# Patient Record
Sex: Female | Born: 1980
Health system: Southern US, Community
[De-identification: ages and names within clinical notes are randomized; demographics above are authoritative.]

## PROBLEM LIST (undated history)

## (undated) DIAGNOSIS — F419 Anxiety disorder, unspecified: Secondary | ICD-10-CM

## (undated) DIAGNOSIS — B019 Varicella without complication: Secondary | ICD-10-CM

## (undated) DIAGNOSIS — F329 Major depressive disorder, single episode, unspecified: Secondary | ICD-10-CM

## (undated) DIAGNOSIS — E785 Hyperlipidemia, unspecified: Secondary | ICD-10-CM

## (undated) DIAGNOSIS — R32 Unspecified urinary incontinence: Secondary | ICD-10-CM

## (undated) DIAGNOSIS — K219 Gastro-esophageal reflux disease without esophagitis: Secondary | ICD-10-CM

## (undated) DIAGNOSIS — Z87442 Personal history of urinary calculi: Secondary | ICD-10-CM

## (undated) DIAGNOSIS — F32A Depression, unspecified: Secondary | ICD-10-CM

## (undated) DIAGNOSIS — N390 Urinary tract infection, site not specified: Secondary | ICD-10-CM

## (undated) DIAGNOSIS — G43909 Migraine, unspecified, not intractable, without status migrainosus: Secondary | ICD-10-CM

## (undated) HISTORY — PX: OTHER SURGICAL HISTORY: SHX169

## (undated) HISTORY — DX: Varicella without complication: B01.9

## (undated) HISTORY — DX: Migraine, unspecified, not intractable, without status migrainosus: G43.909

## (undated) HISTORY — DX: Unspecified urinary incontinence: R32

## (undated) HISTORY — DX: Depression, unspecified: F32.A

## (undated) HISTORY — DX: Gastro-esophageal reflux disease without esophagitis: K21.9

## (undated) HISTORY — DX: Urinary tract infection, site not specified: N39.0

## (undated) HISTORY — DX: Hyperlipidemia, unspecified: E78.5

## (undated) HISTORY — DX: Anxiety disorder, unspecified: F41.9

## (undated) HISTORY — DX: Major depressive disorder, single episode, unspecified: F32.9

---

## 2000-04-04 HISTORY — PX: URETERAL STENT PLACEMENT: SHX822

## 2005-10-02 ENCOUNTER — Emergency Department (HOSPITAL_COMMUNITY): Admission: EM | Admit: 2005-10-02 | Discharge: 2005-10-03 | Payer: Self-pay | Admitting: Emergency Medicine

## 2005-10-07 ENCOUNTER — Ambulatory Visit (HOSPITAL_COMMUNITY): Admission: RE | Admit: 2005-10-07 | Discharge: 2005-10-07 | Payer: Self-pay | Admitting: Emergency Medicine

## 2006-03-08 ENCOUNTER — Encounter (INDEPENDENT_AMBULATORY_CARE_PROVIDER_SITE_OTHER): Payer: Self-pay | Admitting: Specialist

## 2006-03-08 ENCOUNTER — Inpatient Hospital Stay (HOSPITAL_COMMUNITY): Admission: EM | Admit: 2006-03-08 | Discharge: 2006-03-09 | Payer: Self-pay | Admitting: Emergency Medicine

## 2007-04-05 HISTORY — PX: APPENDECTOMY: SHX54

## 2009-05-20 ENCOUNTER — Emergency Department (HOSPITAL_BASED_OUTPATIENT_CLINIC_OR_DEPARTMENT_OTHER): Admission: EM | Admit: 2009-05-20 | Discharge: 2009-05-20 | Payer: Self-pay | Admitting: Emergency Medicine

## 2009-06-02 ENCOUNTER — Encounter: Admission: RE | Admit: 2009-06-02 | Discharge: 2009-06-02 | Payer: Self-pay | Admitting: Gastroenterology

## 2010-06-24 LAB — PREGNANCY, URINE: Preg Test, Ur: NEGATIVE

## 2010-06-24 LAB — URINALYSIS, ROUTINE W REFLEX MICROSCOPIC
Bilirubin Urine: NEGATIVE
Hgb urine dipstick: NEGATIVE
Ketones, ur: NEGATIVE mg/dL
pH: 8 (ref 5.0–8.0)

## 2010-08-20 NOTE — Op Note (Signed)
NAMENOVELLA, ABRAHA              ACCOUNT NO.:  [id number]   MEDICAL RECORD NO.:  [id number]          PATIENT TYPE:  INP   LOCATION:  1615                         FACILITY:  [facility]   PHYSICIAN:  Anselm Pancoast. Weatherly, M.D.DATE OF BIRTH:  03-06-1981   DATE OF PROCEDURE:  03/08/2006  DATE OF DISCHARGE:                               OPERATIVE REPORT   PREOPERATIVE DIAGNOSIS:  Acute appendicitis.   POSTOPERATIVE DIAGNOSIS:  Acute appendicitis.   OPERATION PERFORMED:  Laparoscopic appendectomy.   SURGEON:  Anselm Pancoast. Zachery Dakins, M.D.   ASSISTANT:  Nurse.   ANESTHESIA:  General.   INDICATIONS FOR PROCEDURE:  Eleasha Cataldo is a 30 year old female who  started having increasing abdominal pain yesterday.  Came to emergency  room at approximately 1 a.m., had approximately 16,000 white count.  CT  findings consistent with acute appendicitis.  It was not thought to be  ruptured.  I saw her approximately 5:30 and added her to the operating  room schedule for this morning.  She had been given 3 g of Unasyn  approximately 5:30 a.m. and permission was obtained and permit signed  for laparoscopic appendectomy.   DESCRIPTION OF PROCEDURE:  The patient was taken back to the operative  suite.  It was approximately 7 a.m. when we finally got in the operating  room.  The abdomen after induction of general anesthesia, Foley catheter  inserted, was prepped with Betadine solution, draped in sterile manner.  She has a little fascial defect at the umbilicus and I made a small  incision below it.  I opened up through the actual umbilicus, opened up  the peritoneum and a pursestring suture of 0 Vicryl was placed and the  Hasson cannula introduced. On inspection with the camera you could see a  little inflammatory response in the right lower quadrant.  She has  adhesions around her uterus.  The upper 5 mm port was placed up right  lateral and then under direct vision, I was then able to identify the  appendix and actually grab it with a million dollar grasper.  The 10-11  was placed in the left lower quadrant and I left the camera in the left  lower quadrant and I worked with the umbilical and right upper quadrant  5 mm port.  The appendix could be brought up.  The appendiceal mesentery  was kind of on corkscrew and this was divided with the Harmonic scalpel.  Good hemostasis was obtained.  There was a little bleeding actually from  the appendix where I had first grabbed it but with the controlling of  the mesenteric vessels, that of course stopped bleeding.  I then  switched the camera to the umbilical port and used the vascular stapler,  brought the left lower across the base of the appendix, fired it and the  appendix was removed.  The acutely inflamed appendix was then placed in  the EndoCatch bag and then we brought it out in the bag at the umbilical  port.  The inspection, the little bit of blood that had been lost was  irrigated and aspirated and kept seeing  just a little bit of blood and  whether it was acute or old, I could not really tell and I actually had  taken the 10-11 port in the left lower quadrant out and noted that there  was still a little bleeding.  Reinserted it and then using the Harmonic  scalpel where the little areas where the __________ had been divided  appeared to have a little bleeding and this was lightly coagulated with  the Harmonic scalpel.   Next the irrigating fluid was all clear.  This was all removed, the  cecum was in its normal position.  We then removed the 10-11 in the left  lower quadrant, the 5 in the upper quadrant and released the carbon  dioxide.  The irrigating fluid had been aspirated.  I closed the  umbilical area with a figure-of-eight in addition to the pursestring  suture and anesthetized the fascia at the umbilicus with about 10 mL of  Marcaine. Next. I placed a 0 Vicryl in the anterior fascia of the left  lower quadrant port and  then the subcutaneous wounds were closed with 4-  0 Vicryl and benzoin and the patient was extubated and taken to recovery  room in stable postoperative condition.  We removed her Foley catheter  and I will leave it to her and her husband whether she possibly goes  home later this afternoon or possibly spending the night and go in the  morning.  She has two small children and I will let them make the  decision on what she prefers.  I will give her one additional dose of  Unasyn and hopefully she will be able to tolerate clear liquid diet  today without problems.           ______________________________  Anselm Pancoast. Zachery Dakins, M.D.     WJW/MEDQ  D:  03/08/2006  T:  03/08/2006  Job:  [id number]

## 2010-08-20 NOTE — H&P (Signed)
Alexandra Edwards, Alexandra Edwards              ACCOUNT NO.:  [id number]   MEDICAL RECORD NO.:  [id number]          PATIENT TYPE:  INP   LOCATION:  1615                         FACILITY:  [facility]   PHYSICIAN:  Anselm Pancoast. Weatherly, M.D.DATE OF BIRTH:  [birth year]   DATE OF ADMISSION:  03/08/2006  DATE OF DISCHARGE:                              HISTORY & PHYSICAL   CHIEF COMPLAINT:  Abdominal pain.   HISTORY OF PRESENT ILLNESS:  Alexandra Edwards is a 30 year old female who  came to the emergency room approximately 1:00 a.m. today.  She has had  increasing abdominal pain, increased in the right lower abdomen this  evening.  She came to the emergency room, received by the ER physician,  Dr. Denton Lank.  White count was 15,800.  Urinalysis was unremarkable.  She  has had two previous C-sections, and otherwise, in good health.  CT was  obtained, and this was completed approximately 4:30 to 5:00 and showed  acutely inflamed appendix.  I was on call and asked to come in to see  the patient.  She had received Zofran and morphine, and also given 3 g  of Unasyn.  When I saw her, she was actually comfortable.  Was mildly  tender with deep palpation.  Certainly not better with acute surgical  abdomen, but most likely this was related to the pain medication she  had.   PAST MEDICAL HISTORY:  Basically unremarkable.   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  She has two children.   REVIEW OF SYSTEMS:  Negative.   PHYSICAL EXAMINATION:  VITAL SIGNS:  Temperature 97.3, pulse 93,  respirations 18, blood pressure 90/54.  GENERAL:  She is a Caucasian female in no acute distress.  Kind of half  sleeping.  LUNGS:  Clear.  CARDIAC:  Normal sinus rhythm.  ABDOMEN:  She was mildly tender in the right lower quadrant but not  really significant rebound.  She is not distended.   STUDIES:  Reviewed the CT that sharply showed acutely inflamed appendix.  The appendix was lying kind of  medial to the cecum and does not appear  to be ruptured.  Recommended we proceed on with a laparoscopic  appendectomy, and she was given 3 g of Unasyn at approximately 5:30 a.m.   IMPRESSION:  Acute appendicitis.           ______________________________  Anselm Pancoast. Zachery Dakins, M.D.     WJW/MEDQ  D:  03/08/2006  T:  03/08/2006  Job:  [id number]

## 2013-06-10 ENCOUNTER — Encounter (HOSPITAL_BASED_OUTPATIENT_CLINIC_OR_DEPARTMENT_OTHER): Payer: Self-pay | Admitting: Emergency Medicine

## 2013-06-10 ENCOUNTER — Emergency Department (HOSPITAL_BASED_OUTPATIENT_CLINIC_OR_DEPARTMENT_OTHER)
Admission: EM | Admit: 2013-06-10 | Discharge: 2013-06-10 | Disposition: A | Discharge status: Home / Self Care | Payer: 59 | Attending: Emergency Medicine | Admitting: Emergency Medicine

## 2013-06-10 ENCOUNTER — Emergency Department (HOSPITAL_BASED_OUTPATIENT_CLINIC_OR_DEPARTMENT_OTHER): Payer: 59

## 2013-06-10 DIAGNOSIS — N200 Calculus of kidney: Secondary | ICD-10-CM | POA: Insufficient documentation

## 2013-06-10 DIAGNOSIS — N1330 Unspecified hydronephrosis: Secondary | ICD-10-CM | POA: Insufficient documentation

## 2013-06-10 DIAGNOSIS — Z3202 Encounter for pregnancy test, result negative: Secondary | ICD-10-CM | POA: Insufficient documentation

## 2013-06-10 LAB — CBC WITH DIFFERENTIAL/PLATELET
BASOS ABS: 0 10*3/uL (ref 0.0–0.1)
Basophils Relative: 0 % (ref 0–1)
EOS ABS: 0.1 10*3/uL (ref 0.0–0.7)
Eosinophils Relative: 2 % (ref 0–5)
HCT: 42.8 % (ref 36.0–46.0)
HEMOGLOBIN: 14.6 g/dL (ref 12.0–15.0)
LYMPHS ABS: 2 10*3/uL (ref 0.7–4.0)
LYMPHS PCT: 39 % (ref 12–46)
MCH: 29.4 pg (ref 26.0–34.0)
MCHC: 34.1 g/dL (ref 30.0–36.0)
MCV: 86.1 fL (ref 78.0–100.0)
MONO ABS: 0.5 10*3/uL (ref 0.1–1.0)
Monocytes Relative: 9 % (ref 3–12)
NEUTROS ABS: 2.6 10*3/uL (ref 1.7–7.7)
Neutrophils Relative %: 51 % (ref 43–77)
PLATELETS: 253 10*3/uL (ref 150–400)
RBC: 4.97 MIL/uL (ref 3.87–5.11)
RDW: 13 % (ref 11.5–15.5)
WBC: 5.1 10*3/uL (ref 4.0–10.5)

## 2013-06-10 LAB — BASIC METABOLIC PANEL
BUN: 19 mg/dL (ref 6–23)
CALCIUM: 10 mg/dL (ref 8.4–10.5)
CO2: 25 mEq/L (ref 19–32)
CREATININE: 0.9 mg/dL (ref 0.50–1.10)
Chloride: 102 mEq/L (ref 96–112)
GFR calc Af Amer: >90 mL/min (ref >90)
GFR calc non Af Amer: 82 mL/min — ABNORMAL LOW (ref >90)
Glucose, Bld: 89 mg/dL (ref 70–99)
POTASSIUM: 4.5 meq/L (ref 3.7–5.3)
SODIUM: 140 meq/L (ref 137–147)

## 2013-06-10 LAB — URINE MICROSCOPIC-ADD ON

## 2013-06-10 LAB — URINALYSIS, ROUTINE W REFLEX MICROSCOPIC
Bilirubin Urine: NEGATIVE
GLUCOSE, UA: NEGATIVE mg/dL
KETONES UR: NEGATIVE mg/dL
NITRITE: NEGATIVE
PH: 6 (ref 5.0–8.0)
Protein, ur: 30 mg/dL — AB
SPECIFIC GRAVITY, URINE: 1.029 (ref 1.005–1.030)
UROBILINOGEN UA: 0.2 mg/dL (ref 0.0–1.0)

## 2013-06-10 LAB — PREGNANCY, URINE: PREG TEST UR: NEGATIVE

## 2013-06-10 MED ORDER — METHYLPREDNISOLONE SODIUM SUCC 125 MG IJ SOLR: 125.0000 mg | Freq: Once | INTRAMUSCULAR | Status: DC

## 2013-06-10 MED ORDER — ONDANSETRON 4 MG PO TBDP: ORAL_TABLET | ORAL | Status: DC

## 2013-06-10 MED ORDER — HYDROCODONE-ACETAMINOPHEN 5-325 MG PO TABS: 1.0000 | ORAL_TABLET | ORAL | Status: DC | PRN

## 2013-06-10 MED ORDER — SODIUM CHLORIDE 0.9 % IV BOLUS (SEPSIS)
1000.0000 mL | Freq: Once | INTRAVENOUS | Status: AC
  Administered 2013-06-10: 1000 mL via INTRAVENOUS

## 2013-06-10 MED ORDER — HYDROMORPHONE HCL PF 1 MG/ML IJ SOLN
1.0000 mg | Freq: Once | INTRAMUSCULAR | Status: AC
  Administered 2013-06-10: 1 mg via INTRAVENOUS
  Filled 2013-06-10: qty 1

## 2013-06-10 MED ORDER — DIPHENHYDRAMINE HCL 50 MG/ML IJ SOLN: 50.0000 mg | Freq: Once | INTRAMUSCULAR | Status: DC

## 2013-06-10 MED ORDER — FAMOTIDINE IN NACL 20-0.9 MG/50ML-% IV SOLN: 20.0000 mg | Freq: Once | INTRAVENOUS | Status: DC

## 2013-06-10 MED ORDER — ONDANSETRON HCL 4 MG/2ML IJ SOLN
4.0000 mg | Freq: Once | INTRAMUSCULAR | Status: AC
  Administered 2013-06-10: 4 mg via INTRAVENOUS
  Filled 2013-06-10: qty 2

## 2013-06-10 NOTE — Discharge Instructions (Signed)
Hydronephrosis °Hydronephrosis is an abnormal enlargement of your kidney. It can affect one or both the kidneys. It results from the backward pressure of urine on the kidneys, when the flow of urine is blocked. Normally, the urine drains from the kidney through the urine tube (ureter), into a sac which holds the urine until urination (bladder). When the urinary flow is blocked, the urine collects above the block. This causes an increase in the pressure inside the kidney, which in turn leads to its enlargement. The block can occur at the point where the kidney joins the ureter. Treatment depends on the cause and location of the block.  °CAUSES  °The causes of this condition include: °· Birth defect of the kidney or ureter. °· Kink at the point where the kidney joins the ureter. °· Stones and blood clots in the kidney or ureter. °· Cancer, injury, or infection of the ureter. °· Scar tissue formation. °· Backflow of urine (reflux). °· Cancer of bladder or prostate gland. °· Abnormality of the nerves or muscles of the kidney or ureter. °· Lower part of the ureter protruding into the bladder (ureterocele). °· Abnormal contractions of the bladder. °· Both the kidneys can be affected during pregnancy. This is because the enlarging uterus presses on the ureters and blocks the flow of urine. °SYMPTOMS  °The symptoms depend on the location of the block. They also depend on how long the block has been present. You may feel pain on the affected side. Sometimes, you may not have any symptoms. There may be a dull ache or discomfort in the flank. The common symptoms are: °· Flank pain. °· Swelling of the abdomen. °· Pain in the abdomen. °· Nausea and vomiting. °· Fever. °· Pain while passing urine. °· Urgency for urination. °· Frequent or urgent urination. °· Infection of the urinary tract. °DIAGNOSIS  °Your caregiver will examine you after asking about your symptoms. You may be asked to do blood and urine tests. Your caregiver  may order a special X-ray, ultrasound, or CT scan. Sometimes a rigid or flexible telescope (cystoscope) is used to view the site of the blockage.  °TREATMENT  °Treatment depends on the site, cause, and duration of the block. The goal of treatment is to remove the blockage. Your caregiver will plan the treatment based on your condition. The different types of treatment are:  °· Putting in a soft plastic tube (ureteral stent) to connect the bladder with the kidney. This will help in draining the urine. °· Putting in a soft tube (nephrostomy tube). This is placed through skin into the kidney. The trapped urine is drained out through the back. A plastic bag is attached to your skin to hold the urine that has drained out. °· Antibiotics to treat or prevent infection. °· Breaking down of the stone (lithotripsy). °HOME CARE INSTRUCTIONS  °· It may take some time for the hydronephrosis to go away (resolve). Drink fluids as directed by your caregiver , and get a lot of rest. °· If you have a drain in, your caregiver will give you directions about how to care for it. Be sure you understand these directions completely before you go home. °· Take any antibiotics, pain medications, or other prescriptions exactly as prescribed. °· Follow-up with your caregivers as directed. °SEEK MEDICAL CARE IF:  °· You continue to have flank pain, nausea, or difficulty with urination. °· You have any problem with any type of drainage device. °· Your urine becomes cloudy or bloody. °SEEK   IMMEDIATE MEDICAL CARE IF:  °· You have severe flank and/or abdominal pain. °· You develop vomiting and are unable to hold down fluids. °· You develop a fever above 100.5° F (38.1° C), or as per your caregiver. °MAKE SURE YOU:  °· Understand these instructions. °· Will watch your condition. °· Will get help right away if you are not doing well or get worse. °Document Released: 01/16/2007 Document Revised: 06/13/2011 Document Reviewed: 03/04/2010 °ExitCare®  Patient Information ©2014 [employer]. ° °Kidney Stones °Kidney stones (urolithiasis) are deposits that form inside your kidneys. The intense pain is caused by the stone moving through the urinary tract. When the stone moves, the ureter goes into spasm around the stone. The stone is usually passed in the urine.  °CAUSES  °· A disorder that makes certain neck glands produce too much parathyroid hormone (primary hyperparathyroidism). °· A buildup of uric acid crystals, similar to gout in your joints. °· Narrowing (stricture) of the ureter. °· A kidney obstruction present at birth (congenital obstruction). °· Previous surgery on the kidney or ureters. °· Numerous kidney infections. °SYMPTOMS  °· Feeling sick to your stomach (nauseous). °· Throwing up (vomiting). °· Blood in the urine (hematuria). °· Pain that usually spreads (radiates) to the groin. °· Frequency or urgency of urination. °DIAGNOSIS  °· Taking a history and physical exam. °· Blood or urine tests. °· CT scan. °· Occasionally, an examination of the inside of the urinary bladder (cystoscopy) is performed. °TREATMENT  °· Observation. °· Increasing your fluid intake. °· Extracorporeal shock wave lithotripsy This is a noninvasive procedure that uses shock waves to break up kidney stones. °· Surgery may be needed if you have severe pain or persistent obstruction. There are various surgical procedures. Most of the procedures are performed with the use of small instruments. Only small incisions are needed to accommodate these instruments, so recovery time is minimized. °The size, location, and chemical composition are all important variables that will determine the proper choice of action for you. Talk to your health care provider to better understand your situation so that you will minimize the risk of injury to yourself and your kidney.  °HOME CARE INSTRUCTIONS  °· Drink enough water and fluids to keep your urine clear or pale yellow. This will help you to  pass the stone or stone fragments. °· Strain all urine through the provided strainer. Keep all particulate matter and stones for your health care provider to see. The stone causing the pain may be as small as a grain of salt. It is very important to use the strainer each and every time you pass your urine. The collection of your stone will allow your health care provider to analyze it and verify that a stone has actually passed. The stone analysis will often identify what you can do to reduce the incidence of recurrences. °· Only take over-the-counter or prescription medicines for pain, discomfort, or fever as directed by your health care provider. °· Make a follow-up appointment with your health care provider as directed. °· Get follow-up X-rays if required. The absence of pain does not always mean that the stone has passed. It may have only stopped moving. If the urine remains completely obstructed, it can cause loss of kidney function or even complete destruction of the kidney. It is your responsibility to make sure X-rays and follow-ups are completed. Ultrasounds of the kidney can show blockages and the status of the kidney. Ultrasounds are not associated with any radiation and can be   performed easily in a matter of minutes. °SEEK MEDICAL CARE IF: °· You experience pain that is progressive and unresponsive to any pain medicine you have been prescribed. °SEEK IMMEDIATE MEDICAL CARE IF:  °· Pain cannot be controlled with the prescribed medicine. °· You have a fever or shaking chills. °· The severity or intensity of pain increases over 18 hours and is not relieved by pain medicine. °· You develop a new onset of abdominal pain. °· You feel faint or pass out. °· You are unable to urinate. °MAKE SURE YOU:  °· Understand these instructions. °· Will watch your condition. °· Will get help right away if you are not doing well or get worse. °Document Released: 03/21/2005 Document Revised: 11/21/2012 Document Reviewed:  08/22/2012 °ExitCare® Patient Information ©2014 [employer]. ° °

## 2013-06-10 NOTE — ED Notes (Signed)
Patient asked to provide clean catch urine sample. 

## 2013-06-10 NOTE — ED Provider Notes (Signed)
CSN: [id number]     Arrival date & time 06/10/13  6962 History   First MD Initiated Contact with Patient 06/10/13 1018     Chief Complaint  Patient presents with  . Nephrolithiasis     (Consider location/radiation/quality/duration/timing/severity/associated sxs/prior Treatment) HPI Comments: 33 year old female presents with 2 hours of left flank pain. She states she has a constant 8/10 pain and then coming and going worsening because to 10 out of 10. States it feels like prior kidney stones. She's had them yearly her multiple times a year but has not had a kidney stone in over 5 years. She's had to have a couple stones "blasted out". Patient denies any hematuria or dysuria. No nausea, vomiting, or fevers. Has not taken anything for the pain. Does not have a current urologist. Last saw Vladimir Creeks in [facility].  No radiation of her pain. No abdominal pain. It feels better to lay on her right side.   Past Medical History  Diagnosis Date  . Kidney stones    Past Surgical History  Procedure Laterality Date  . Cesarean section    . Appendectomy     History reviewed. No pertinent family history. History  Substance Use Topics  . Smoking status: Never Smoker   . Smokeless tobacco: Not on file  . Alcohol Use: No     Comment: socially   OB History   Grav Para Term Preterm Abortions TAB SAB Ect Mult Living                 Review of Systems  Constitutional: Negative for fever and chills.  Gastrointestinal: Negative for nausea, vomiting and abdominal pain.  Genitourinary: Positive for flank pain. Negative for dysuria and hematuria.  Musculoskeletal: Negative for back pain.  All other systems reviewed and are negative.      Allergies  Review of patient's allergies indicates no known allergies.  Home Medications  No current outpatient prescriptions on file. BP 110/70  Pulse 91  Temp(Src) 98.1 F (36.7 C) (Oral)  Resp 20  Ht 5\' 2"  (1.575 m)  Wt 180 lb (81.647 kg)  BMI  32.91 kg/m2  SpO2 97% Physical Exam  Nursing note and vitals reviewed. Constitutional: She is oriented to person, place, and time. She appears well-developed and well-nourished.  Laying on right side, appears uncomfortable  HENT:  Head: Normocephalic and atraumatic.  Right Ear: External ear normal.  Left Ear: External ear normal.  Nose: Nose normal.  Eyes: Right eye exhibits no discharge. Left eye exhibits no discharge.  Cardiovascular: Normal rate, regular rhythm and normal heart sounds.   Pulmonary/Chest: Effort normal and breath sounds normal.  Abdominal: Soft. She exhibits no distension. There is no tenderness. There is CVA tenderness (left sided).  Neurological: She is alert and oriented to person, place, and time.  Skin: Skin is warm and dry.    ED Course  Procedures (including critical care time) Labs Review Labs Reviewed  URINALYSIS, ROUTINE W REFLEX MICROSCOPIC - Abnormal; Notable for the following:    Color, Urine AMBER (*)    APPearance CLOUDY (*)    Hgb urine dipstick LARGE (*)    Protein, ur 30 (*)    Leukocytes, UA SMALL (*)    All other components within normal limits  BASIC METABOLIC PANEL - Abnormal; Notable for the following:    GFR calc non Af Amer 82 (*)    All other components within normal limits  URINE MICROSCOPIC-ADD ON - Abnormal; Notable for the following:  Squamous Epithelial / LPF MANY (*)    Bacteria, UA FEW (*)    All other components within normal limits  PREGNANCY, URINE  CBC WITH DIFFERENTIAL   Imaging Review Ct Abdomen Pelvis Wo Contrast  06/10/2013   CLINICAL DATA:  Left flank pain, history kidney stones  EXAM: CT ABDOMEN AND PELVIS WITHOUT CONTRAST  TECHNIQUE: Multidetector CT imaging of the abdomen and pelvis was performed following the standard protocol without intravenous contrast. Sagittal and coronal MPR images reconstructed from axial data set. Oral contrast not administered for this indication  COMPARISON:  05/21/2009  FINDINGS:  Lung bases clear.  Bilateral nonobstructing renal calculi.  Peripelvic cysts left kidney.  Additionally, new left hydronephrosis identified with mild left ureteral dilatation.  3 mm diameter distal left ureteral calculus identified image 72.  Bladder and right ureter unremarkable.  Within limits of a nonenhanced exam no focal abnormalities of the liver, spleen, pancreas, or adrenal glands identified.  IUD within uterus.  Uterus, adnexae, stomach, and bowel loops otherwise normal.  No mass, adenopathy, free fluid or inflammatory process.  Bones unremarkable.  IMPRESSION: Peripelvic left renal cysts and nonobstructing bilateral renal calculi.  New left hydronephrosis and hydroureter secondary to a 3 mm diameter distal left ureteral calculus.   Electronically Signed   By: Ulyses SouthwardMark  Boles M.D.   On: 06/10/2013 11:44     EKG Interpretation None      MDM   Final diagnoses:  Kidney stone on left side  Hydronephrosis    Patient's pain controlled with one dose of IV narcotics. She has a 3 mm stone as above. No signs of UTI, fever, or vomiting. She's otherwise well appearing. [place] in [place] Dr. Vladimir Creeksavid Cook, she feels like she can followup within the week. There are no signs of need for urgent removal at this time. We'll encourage good fluid intake, give oral pain control, nausea control, and strict return precautions.    Audree CamelScott T Kanija Remmel, MD 06/10/13 1556

## 2013-06-10 NOTE — ED Notes (Signed)
Pt reports (L) flank pain that started 1 hour PTA.  Hx of kidney stones.

## 2013-06-21 ENCOUNTER — Emergency Department (HOSPITAL_BASED_OUTPATIENT_CLINIC_OR_DEPARTMENT_OTHER): Admission: EM | Admit: 2013-06-21 | Discharge: 2013-06-21 | Discharge status: Against Medical Advice | Payer: 59

## 2014-06-20 ENCOUNTER — Encounter: Payer: Self-pay | Admitting: Nurse Practitioner

## 2014-06-20 ENCOUNTER — Ambulatory Visit (INDEPENDENT_AMBULATORY_CARE_PROVIDER_SITE_OTHER): Payer: 59 | Admitting: Nurse Practitioner

## 2014-06-20 VITALS — BP 104/72 | HR 68 | Temp 98.0°F | Ht 62.25 in | Wt 167.0 lb

## 2014-06-20 DIAGNOSIS — R1013 Epigastric pain: Secondary | ICD-10-CM | POA: Insufficient documentation

## 2014-06-20 LAB — CBC WITH DIFFERENTIAL/PLATELET
BASOS ABS: 0 10*3/uL (ref 0.0–0.1)
BASOS PCT: 0.5 % (ref 0.0–3.0)
EOS ABS: 0.1 10*3/uL (ref 0.0–0.7)
EOS PCT: 1.7 % (ref 0.0–5.0)
HCT: 43.1 % (ref 36.0–46.0)
Hemoglobin: 14.7 g/dL (ref 12.0–15.0)
LYMPHS PCT: 45.4 % (ref 12.0–46.0)
Lymphs Abs: 2.9 10*3/uL (ref 0.7–4.0)
MCHC: 34 g/dL (ref 30.0–36.0)
MCV: 87.1 fl (ref 78.0–100.0)
MONO ABS: 0.4 10*3/uL (ref 0.1–1.0)
Monocytes Relative: 6.3 % (ref 3.0–12.0)
NEUTROS PCT: 46.1 % (ref 43.0–77.0)
Neutro Abs: 2.9 10*3/uL (ref 1.4–7.7)
Platelets: 354 10*3/uL (ref 150.0–400.0)
RBC: 4.95 Mil/uL (ref 3.87–5.11)
RDW: 13.2 % (ref 11.5–15.5)
WBC: 6.4 10*3/uL (ref 4.0–10.5)

## 2014-06-20 LAB — URINALYSIS, ROUTINE W REFLEX MICROSCOPIC
Bilirubin Urine: NEGATIVE
Ketones, ur: NEGATIVE
Nitrite: NEGATIVE
SPECIFIC GRAVITY, URINE: 1.025 (ref 1.000–1.030)
Total Protein, Urine: NEGATIVE
UROBILINOGEN UA: 0.2 (ref 0.0–1.0)
Urine Glucose: NEGATIVE
pH: 6 (ref 5.0–8.0)

## 2014-06-20 LAB — COMPREHENSIVE METABOLIC PANEL
ALBUMIN: 4.5 g/dL (ref 3.5–5.2)
ALT: 20 U/L (ref 0–35)
AST: 15 U/L (ref 0–37)
Alkaline Phosphatase: 72 U/L (ref 39–117)
BUN: 13 mg/dL (ref 6–23)
CALCIUM: 9.5 mg/dL (ref 8.4–10.5)
CHLORIDE: 103 meq/L (ref 96–112)
CO2: 29 meq/L (ref 19–32)
CREATININE: 0.75 mg/dL (ref 0.40–1.20)
GFR: 93.12 mL/min (ref >60.00)
Glucose, Bld: 80 mg/dL (ref 70–99)
POTASSIUM: 4.2 meq/L (ref 3.5–5.1)
Sodium: 138 mEq/L (ref 135–145)
Total Bilirubin: 0.3 mg/dL (ref 0.2–1.2)
Total Protein: 7.6 g/dL (ref 6.0–8.3)

## 2014-06-20 LAB — H. PYLORI ANTIBODY, IGG: H Pylori IgG: NEGATIVE

## 2014-06-20 MED ORDER — OMEPRAZOLE 40 MG PO CPDR: 40.0000 mg | DELAYED_RELEASE_CAPSULE | Freq: Every day | ORAL | Status: DC

## 2014-06-20 NOTE — Progress Notes (Signed)
Subjective:     Alexandra Edwards is a 34 y.o. female who presents for evaluation of abdominal pain. She is establishing care. Onset was 5 days ago. Symptoms have been gradually worsening. The pain is described as intermittent, dull, aching and burning, and is 5/10 in intensity. Pain is located in the epigastric region without radiation.  Aggravating factors: eating.  Alleviating factors: none. Took rolaids with no relief. Associated symptoms: diarrhea X 2 days. The patient denies chills, constipation, fever, flatus and nausea.  The patient's history has been marked as reviewed and updated as appropriate.  Review of Systems Pertinent items are noted in HPI.     Objective:    BP 104/72 mmHg  Pulse 68  Temp(Src) 98 F (36.7 C) (Oral)  Ht 5' 2.25" (1.581 m)  Wt 167 lb (75.751 kg)  BMI 30.31 kg/m2  SpO2 99%  LMP 06/14/2014 General appearance: alert, cooperative, appears stated age and no distress Head: Normocephalic, without obvious abnormality, atraumatic Eyes: negative findings: lids and lashes normal and conjunctivae and sclerae normal Neck: no adenopathy, no carotid bruit, supple, symmetrical, trachea midline and thyroid not enlarged, symmetric, no tenderness/mass/nodules Lungs: clear to auscultation bilaterally Heart: regular rate and rhythm, S1, S2 normal, no murmur, click, rub or gallop Abdomen: normal findings: no masses palpable, no organomegaly and spleen non-palpable and abnormal findings:  epigastric tenderness Neurologic: Grossly normal    Assessment:Plan     1. Epigastric pain DD: gastritis, PUD, gallbladder disease - CBC with Differential/Platelet - Comprehensive metabolic panel - H. pylori antibody, IgG - Urinalysis, Routine w reflex microscopic - omeprazole (PRILOSEC) 40 MG capsule; Take 1 capsule (40 mg total) by mouth daily.  Dispense: 30 capsule; Refill: 1  F/u 4 weeks

## 2014-06-20 NOTE — Patient Instructions (Signed)
My office will call with lab results.  Start omeprazole today. Take next doses in mornings.  Start probiotic-Align. Take daily until next appointment. You may take TUMS or rolaids for breakthrough symptoms if needed.  Diet changes: soft , bland diet: eggs, steamed vegetables, non-citrus fruit, brown rice, cereal, potatoes, smoothies, yogurt, apple sauce, whole wheat breads , peanut butter. no tomato, spicy, citrus, coffee, or alcohol for 2 weeks. Eat very little meat in next 2 weeks. If no symptoms, you may add 1 or 2 of these, per week, back to diet if you tolerate.  See me in 4 weeks.  Very nice to meet you!

## 2014-06-20 NOTE — Progress Notes (Signed)
Pre visit review using our clinic review tool, if applicable. No additional management support is needed unless otherwise documented below in the visit note. 

## 2014-06-23 ENCOUNTER — Telehealth: Payer: Self-pay | Admitting: Nurse Practitioner

## 2014-06-23 NOTE — Telephone Encounter (Signed)
pls call pt: Advise She is neg for H pylori bacteria, so no ABX, she should continue omeprazole.   Her urine had some bacteria. Pls call lab to see if a culture can be ran.  If not, tell her that her urine showed some bacteria. This could be from vaginal secretions. If she will come in to give another sample, we can cutlure it to determine where bacteria is coming from. If it is urine, I will start ABX.  All other labs look good. No bilirubin, so pain does not appear to be r/t gallbladder.

## 2014-06-23 NOTE — Telephone Encounter (Signed)
Urine culture can not be added if it has been longer than 24 hours. LMOVM for pt to return call.

## 2014-06-24 NOTE — Telephone Encounter (Signed)
Attempted to call patient, someone answered phone but was unable to hear anyone on the other line.

## 2014-06-25 ENCOUNTER — Other Ambulatory Visit (INDEPENDENT_AMBULATORY_CARE_PROVIDER_SITE_OTHER): Payer: 59

## 2014-06-25 ENCOUNTER — Other Ambulatory Visit: Payer: Self-pay | Admitting: Nurse Practitioner

## 2014-06-25 DIAGNOSIS — R8271 Bacteriuria: Secondary | ICD-10-CM

## 2014-06-25 DIAGNOSIS — R1013 Epigastric pain: Secondary | ICD-10-CM

## 2014-06-25 NOTE — Telephone Encounter (Signed)
FYI: Called and informed patient of results. She is going to come in today (3/23) to give us a urine sample to culture.

## 2014-06-26 LAB — URINE CULTURE
COLONY COUNT: NO GROWTH
ORGANISM ID, BACTERIA: NO GROWTH

## 2014-06-30 ENCOUNTER — Telehealth: Payer: Self-pay | Admitting: Nurse Practitioner

## 2014-06-30 NOTE — Telephone Encounter (Signed)
LMOVM-identified about lab results. Patient to call back with any questions or concerns. DPR signed.

## 2014-06-30 NOTE — Telephone Encounter (Signed)
pls call pt: Advise No bacteria in urine. Keep f/u appt.

## 2014-07-23 ENCOUNTER — Ambulatory Visit: Payer: 59 | Admitting: Nurse Practitioner

## 2014-11-20 ENCOUNTER — Ambulatory Visit: Payer: 59 | Admitting: Family Medicine

## 2016-11-08 ENCOUNTER — Emergency Department (HOSPITAL_BASED_OUTPATIENT_CLINIC_OR_DEPARTMENT_OTHER): Payer: 59

## 2016-11-08 ENCOUNTER — Emergency Department (HOSPITAL_BASED_OUTPATIENT_CLINIC_OR_DEPARTMENT_OTHER)
Admission: EM | Admit: 2016-11-08 | Discharge: 2016-11-08 | Disposition: A | Discharge status: Home / Self Care | Payer: 59 | Attending: Emergency Medicine | Admitting: Emergency Medicine

## 2016-11-08 ENCOUNTER — Encounter (HOSPITAL_BASED_OUTPATIENT_CLINIC_OR_DEPARTMENT_OTHER): Payer: Self-pay | Admitting: *Deleted

## 2016-11-08 DIAGNOSIS — R112 Nausea with vomiting, unspecified: Secondary | ICD-10-CM | POA: Diagnosis not present

## 2016-11-08 DIAGNOSIS — N390 Urinary tract infection, site not specified: Secondary | ICD-10-CM | POA: Diagnosis not present

## 2016-11-08 DIAGNOSIS — R1032 Left lower quadrant pain: Secondary | ICD-10-CM | POA: Diagnosis present

## 2016-11-08 DIAGNOSIS — N200 Calculus of kidney: Secondary | ICD-10-CM | POA: Diagnosis not present

## 2016-11-08 DIAGNOSIS — R109 Unspecified abdominal pain: Secondary | ICD-10-CM

## 2016-11-08 LAB — COMPREHENSIVE METABOLIC PANEL
ALBUMIN: 4.3 g/dL (ref 3.5–5.0)
ALT: 30 U/L (ref 14–54)
ANION GAP: 12 (ref 5–15)
AST: 27 U/L (ref 15–41)
Alkaline Phosphatase: 66 U/L (ref 38–126)
BUN: 11 mg/dL (ref 6–20)
CHLORIDE: 102 mmol/L (ref 101–111)
CO2: 24 mmol/L (ref 22–32)
Calcium: 9.4 mg/dL (ref 8.9–10.3)
Creatinine, Ser: 0.81 mg/dL (ref 0.44–1.00)
GFR calc Af Amer: >60 mL/min (ref >60)
GFR calc non Af Amer: >60 mL/min (ref >60)
GLUCOSE: 97 mg/dL (ref 65–99)
POTASSIUM: 3.6 mmol/L (ref 3.5–5.1)
Sodium: 138 mmol/L (ref 135–145)
Total Bilirubin: 0.6 mg/dL (ref 0.3–1.2)
Total Protein: 7.6 g/dL (ref 6.5–8.1)

## 2016-11-08 LAB — URINALYSIS, MICROSCOPIC (REFLEX)

## 2016-11-08 LAB — URINALYSIS, ROUTINE W REFLEX MICROSCOPIC
BILIRUBIN URINE: NEGATIVE
Glucose, UA: NEGATIVE mg/dL
KETONES UR: NEGATIVE mg/dL
NITRITE: NEGATIVE
PH: 6 (ref 5.0–8.0)
Protein, ur: NEGATIVE mg/dL
Specific Gravity, Urine: 1.02 (ref 1.005–1.030)

## 2016-11-08 LAB — CBC WITH DIFFERENTIAL/PLATELET
Basophils Absolute: 0 10*3/uL (ref 0.0–0.1)
Basophils Relative: 0 %
EOS ABS: 0 10*3/uL (ref 0.0–0.7)
EOS PCT: 0 %
HCT: 40.5 % (ref 36.0–46.0)
Hemoglobin: 14 g/dL (ref 12.0–15.0)
LYMPHS ABS: 1.2 10*3/uL (ref 0.7–4.0)
Lymphocytes Relative: 13 %
MCH: 29.5 pg (ref 26.0–34.0)
MCHC: 34.6 g/dL (ref 30.0–36.0)
MCV: 85.4 fL (ref 78.0–100.0)
Monocytes Absolute: 0.4 10*3/uL (ref 0.1–1.0)
Monocytes Relative: 4 %
Neutro Abs: 7.7 10*3/uL (ref 1.7–7.7)
Neutrophils Relative %: 83 %
PLATELETS: 296 10*3/uL (ref 150–400)
RBC: 4.74 MIL/uL (ref 3.87–5.11)
RDW: 13.3 % (ref 11.5–15.5)
WBC: 9.3 10*3/uL (ref 4.0–10.5)

## 2016-11-08 LAB — LIPASE, BLOOD: LIPASE: 28 U/L (ref 11–51)

## 2016-11-08 LAB — PREGNANCY, URINE: Preg Test, Ur: NEGATIVE

## 2016-11-08 MED ORDER — ONDANSETRON HCL 4 MG/2ML IJ SOLN
4.0000 mg | Freq: Once | INTRAMUSCULAR | Status: AC
  Administered 2016-11-08: 4 mg via INTRAVENOUS
  Filled 2016-11-08: qty 2

## 2016-11-08 MED ORDER — NAPROXEN 500 MG PO TABS: 500.0000 mg | ORAL_TABLET | Freq: Two times a day (BID) | ORAL | 0 refills | Status: DC | PRN

## 2016-11-08 MED ORDER — MORPHINE SULFATE (PF) 4 MG/ML IV SOLN
4.0000 mg | Freq: Once | INTRAVENOUS | Status: AC
  Administered 2016-11-08: 4 mg via INTRAVENOUS
  Filled 2016-11-08: qty 1

## 2016-11-08 MED ORDER — ONDANSETRON 8 MG PO TBDP: 8.0000 mg | ORAL_TABLET | Freq: Three times a day (TID) | ORAL | 0 refills | Status: DC | PRN

## 2016-11-08 MED ORDER — SULFAMETHOXAZOLE-TRIMETHOPRIM 800-160 MG PO TABS: 1.0000 | ORAL_TABLET | Freq: Two times a day (BID) | ORAL | 0 refills | Status: AC

## 2016-11-08 MED ORDER — SODIUM CHLORIDE 0.9 % IV BOLUS (SEPSIS)
1000.0000 mL | Freq: Once | INTRAVENOUS | Status: AC
  Administered 2016-11-08: 1000 mL via INTRAVENOUS

## 2016-11-08 MED ORDER — KETOROLAC TROMETHAMINE 30 MG/ML IJ SOLN
15.0000 mg | Freq: Once | INTRAMUSCULAR | Status: AC
  Administered 2016-11-08: 15 mg via INTRAVENOUS
  Filled 2016-11-08: qty 1

## 2016-11-08 MED ORDER — HYDROCODONE-ACETAMINOPHEN 5-325 MG PO TABS: 1.0000 | ORAL_TABLET | Freq: Four times a day (QID) | ORAL | 0 refills | Status: DC | PRN

## 2016-11-08 MED FILL — SULFAMETHOXAZOLE/TMP DS TAB: 800-160 | 5 days supply | Qty: 10 | Fill #0

## 2016-11-08 MED FILL — HYDROCODON-APAP 5-325: 5-325 | 5 days supply | Qty: 20 | Fill #0

## 2016-11-08 MED FILL — ONDANSETRON ODT 8 MG TABLET: 8 | 3 days supply | Qty: 10 | Fill #0

## 2016-11-08 MED FILL — NAPROXEN 500 MG TABLET: 500 | 10 days supply | Qty: 20 | Fill #0

## 2016-11-08 NOTE — Discharge Instructions (Signed)
Take naprosyn as directed as needed for pain using norco for breakthrough pain. Do not drive or operate machinery with pain medication use. May need over-the-counter stool softener with this pain medication use. Use Zofran as needed for nausea. Strain all urine to try to catch the stone when it passes. Take antibiotic as directed until completed. Follow-up with the urologist in the next 1-2 days for ongoing management of this stone, however for intractable or uncontrollable symptoms at home then [facility].

## 2016-11-08 NOTE — ED Provider Notes (Signed)
[facility]   CSN: [id number] Arrival date & time: 11/08/16  1255     History   Chief Complaint Chief Complaint  Patient presents with  . Flank Pain    left    HPI Alexandra Edwards is a 36 y.o. female with a PMHx of nephrolithiasis requiring lithotripsy and stenting in the past, and migraines, with a PSHx of appendectomy and C-section, who presents to the ED with complaints of sudden onset left flank pain since 11:30am (3 hours). She states this feels like prior kidney stones. She describes the pain as 10/10 constant waxing and waning sharp left flank pain that radiates to the left lower quadrant, with no known aggravating or alleviating factors, no treatments tried prior to arrival. She reports associated nausea and 2 episodes of nonbloody nonbilious emesis. Her urologist was Dr. Benancio Deeds and Dr. Earlene Plater at [facility] but she hasn't seen them in 3 yrs. PCP is Dr. Alben Spittle. LMP 10/16/16. She denies fevers, chills, CP, SOB, diarrhea/constipation, obstipation, melena, hematochezia, hematemesis, hematuria, dysuria, urinary urgency/frequency, malodorous urine, vaginal bleeding/discharge, myalgias, arthralgias, numbness, tingling, focal weakness, or any other complaints at this time. Denies recent travel, sick contacts, suspicious food intake, recent/frequent EtOH use, or frequent NSAID use.   The history is provided by the patient and medical records. No language interpreter was used.  Flank Pain  This is a new problem. The current episode started 3 to 5 hours ago. The problem occurs constantly. Progression since onset: waxing/waning. Pertinent negatives include no chest pain, no abdominal pain and no shortness of breath. Nothing aggravates the symptoms. Nothing relieves the symptoms. She has tried nothing for the symptoms. The treatment provided no relief.    Past Medical History:  Diagnosis Date  . Kidney stones   . Migraine   . Urinary incontinence     . Urinary tract infection     Patient Active Problem List   Diagnosis Date Noted  . Epigastric pain 06/20/2014    Past Surgical History:  Procedure Laterality Date  . APPENDECTOMY    . CESAREAN SECTION      OB History    No data available       Home Medications    Prior to Admission medications   Not on File    Family History Family History  Problem Relation Age of Onset  . Cancer Mother        uterine  . Ulcers Paternal Grandmother   . Arthritis Paternal Grandmother     Social History Social History  Substance Use Topics  . Smoking status: Never Smoker  . Smokeless tobacco: Never Used  . Alcohol use No     Comment: socially     Allergies   Patient has no known allergies.   Review of Systems Review of Systems  Constitutional: Negative for chills and fever.  Respiratory: Negative for shortness of breath.   Cardiovascular: Negative for chest pain.  Gastrointestinal: Positive for nausea and vomiting. Negative for abdominal pain, blood in stool, constipation and diarrhea.  Genitourinary: Positive for flank pain. Negative for dysuria, frequency, hematuria, urgency, vaginal bleeding and vaginal discharge.       No malodorous urine  Musculoskeletal: Negative for arthralgias and myalgias.  Skin: Negative for color change.  Allergic/Immunologic: Negative for immunocompromised state.  Neurological: Negative for weakness and numbness.  Psychiatric/Behavioral: Negative for confusion.   All other systems reviewed and are negative for acute change except as noted in the HPI.    Physical Exam  Updated Vital Signs BP 117/82 (BP Location: Left Arm)   Pulse 91   Temp 98.1 F (36.7 C) (Oral)   Resp 18   Ht 5\' 2"  (1.575 m)   Wt 90.7 kg (200 lb)   LMP 10/16/2016   SpO2 100%   BMI 36.58 kg/m   Physical Exam  Constitutional: She is oriented to person, place, and time. Vital signs are normal. She appears well-developed and well-nourished.  Non-toxic  appearance. She appears distressed (uncomfortable appearing).  Afebrile, nontoxic, appears uncomfortable  HENT:  Head: Normocephalic and atraumatic.  Mouth/Throat: Oropharynx is clear and moist and mucous membranes are normal.  Eyes: Conjunctivae and EOM are normal. Right eye exhibits no discharge. Left eye exhibits no discharge.  Neck: Normal range of motion. Neck supple.  Cardiovascular: Normal rate, regular rhythm, normal heart sounds and intact distal pulses.  Exam reveals no gallop and no friction rub.   No murmur heard. Pulmonary/Chest: Effort normal and breath sounds normal. No respiratory distress. She has no decreased breath sounds. She has no wheezes. She has no rhonchi. She has no rales.  Abdominal: Soft. Normal appearance and bowel sounds are normal. She exhibits no distension. There is no tenderness. There is no rigidity, no rebound, no guarding, no CVA tenderness, no tenderness at McBurney's point and negative Murphy's sign.  Soft, NTND, +BS throughout, no r/g/r, neg murphy's, neg mcburney's, no CVA TTP   Musculoskeletal: Normal range of motion.  Neurological: She is alert and oriented to person, place, and time. She has normal strength. No sensory deficit.  Skin: Skin is warm, dry and intact. No rash noted.  Psychiatric: She has a normal mood and affect.  Nursing note and vitals reviewed.    ED Treatments / Results  Labs (all labs ordered are listed, but only abnormal results are displayed) Labs Reviewed  URINALYSIS, ROUTINE W REFLEX MICROSCOPIC - Abnormal; Notable for the following:       Result Value   APPearance CLOUDY (*)    Hgb urine dipstick LARGE (*)    Leukocytes, UA SMALL (*)    All other components within normal limits  URINALYSIS, MICROSCOPIC (REFLEX) - Abnormal; Notable for the following:    Bacteria, UA MANY (*)    Squamous Epithelial / LPF 0-5 (*)    All other components within normal limits  URINE CULTURE  PREGNANCY, URINE  CBC WITH  DIFFERENTIAL/PLATELET  LIPASE, BLOOD  COMPREHENSIVE METABOLIC PANEL    EKG  EKG Interpretation None       Radiology Ct Renal Stone Study  Result Date: 11/08/2016 CLINICAL DATA:  36 year old female with history of left-sided flank pain for 1 day with sudden onset. History of kidney stones. EXAM: CT ABDOMEN AND PELVIS WITHOUT CONTRAST TECHNIQUE: Multidetector CT imaging of the abdomen and pelvis was performed following the standard protocol without IV contrast. COMPARISON:  CT the abdomen and pelvis 06/10/2013. FINDINGS: Lower chest: Unremarkable. Hepatobiliary: Heterogeneous areas of low attenuation throughout the hepatic parenchyma, compatible with a background of heterogeneous hepatic steatosis. No discrete cystic or solid hepatic lesions are confidently identified on today's noncontrast CT examination. Unenhanced appearance of the gallbladder is normal. Pancreas: No definite pancreatic mass or peripancreatic inflammatory changes are noted on today's noncontrast CT examination. Spleen: Unremarkable. Adrenals/Urinary Tract: At the left ureteropelvic junction there is a 9 mm calculus which is associated with mild to moderate proximal left hydroureteronephrosis. Additional nonobstructive calculi are noted within the lower pole collecting system of the right kidney measuring up to 7 mm. No additional  calculi are noted elsewhere along the course of either ureter or within the lumen of the urinary bladder. Unenhanced appearance of the right kidney is otherwise normal. Multiple low-attenuation lesions in the left renal pelvis presumably reflect parapelvic cysts. Unenhanced appearance of the urinary bladder is normal. Bilateral adrenal glands are normal in appearance. Stomach/Bowel: Unenhanced appearance of the stomach is normal. There is no pathologic dilatation of small bowel or colon. Status post appendectomy. Vascular/Lymphatic: No atherosclerotic calcifications or definite aneurysm identified in the  abdominal or pelvic vasculature. No lymphadenopathy noted in the abdomen or pelvis. Reproductive: IUD present in the fundal portion of the endometrial canal. Uterus and ovaries are otherwise unremarkable in appearance. Other: No significant volume of ascites.  No pneumoperitoneum. Musculoskeletal: There are no aggressive appearing lytic or blastic lesions noted in the visualized portions of the skeleton. IMPRESSION: 1. 9 mm calculus at the left ureteropelvic junction with mild to moderate proximal left hydronephrosis. 2. 7 mm nonobstructive calculus also noted in the lower pole collecting system of the right kidney. 3. Heterogeneous hepatic steatosis. 4. Additional incidental findings, as above. Electronically Signed   By: Trudie Reed M.D.   On: 11/08/2016 15:43    Procedures Procedures (including critical care time)  Medications Ordered in ED Medications  ondansetron (ZOFRAN) injection 4 mg (4 mg Intravenous Given 11/08/16 1519)  sodium chloride 0.9 % bolus 1,000 mL (1,000 mLs Intravenous New Bag/Given 11/08/16 1517)  ketorolac (TORADOL) 30 MG/ML injection 15 mg (15 mg Intravenous Given 11/08/16 1517)  morphine 4 MG/ML injection 4 mg (4 mg Intravenous Given 11/08/16 1520)     Initial Impression / Assessment and Plan / ED Course  I have reviewed the triage vital signs and the nursing notes.  Pertinent labs & imaging results that were available during my care of the patient were reviewed by me and considered in my medical decision making (see chart for details).     36 y.o. female here with sudden onset L flank pain x3 hrs. States it feels like prior kidney stones. On exam, no abdominal tenderness, no flank tenderness, but appears uncomfortable. Upreg neg, U/A in process. Will get labs and CT renal study, give pain/nausea meds, and reassess shortly.  3:56 PM Pt feeling greatly improved. CBC w/diff WNL. CMP WNL. Lipase WNL. U/A with nitrite neg, small leuks, TNTC RBCs, many bacteria, 6-30 WBCs,  and 0-5 squamous; will send for culture, but may need to empirically treat as UTI. CT renal showing 9mm obstructive stone at [facility] causing mild to moderate proximal left hydronephrosis; also 7mm nonobstructive lower renal pole stone on right side. Consulted Dr. Ronne Binning at [facility] urology who feels that since her pain is controlled, she could follow up with them in the next 1-2 days, and send home on bactrim for the infection. Rx's for pain meds and nausea meds given. Advised f/up with urology in 1-2days. I explained the diagnosis and have given explicit precautions to return to the ER including for any other new or worsening symptoms. The patient understands and accepts the medical plan as it's been dictated and I have answered their questions. Discharge instructions concerning home care and prescriptions have been given. The patient is STABLE and is discharged to home in good condition.     Final Clinical Impressions(s) / ED Diagnoses   Final diagnoses:  Nephrolithiasis  Left flank pain  Lower urinary tract infectious disease  Nausea and vomiting in adult patient    New Prescriptions New Prescriptions   HYDROCODONE-ACETAMINOPHEN (NORCO) 5-325 MG  TABLET    Take 1 tablet by mouth every 6 (six) hours as needed for severe pain.   NAPROXEN (NAPROSYN) 500 MG TABLET    Take 1 tablet (500 mg total) by mouth 2 (two) times daily as needed for mild pain, moderate pain or headache (TAKE WITH MEALS.).   ONDANSETRON (ZOFRAN ODT) 8 MG DISINTEGRATING TABLET    Take 1 tablet (8 mg total) by mouth every 8 (eight) hours as needed for nausea or vomiting.   SULFAMETHOXAZOLE-TRIMETHOPRIM (BACTRIM DS,SEPTRA DS) 800-160 MG TABLET    Take 1 tablet by mouth 2 (two) times daily.     [address] [place], [place] 11/08/16 1603    Rolan Bucco, MD 11/08/16 [phone]

## 2016-11-08 NOTE — ED Notes (Signed)
RT attempt IV times to unsuccessful.

## 2016-11-08 NOTE — ED Notes (Signed)
Attempted x 2 to start IV without success. 

## 2016-11-08 NOTE — ED Triage Notes (Signed)
Pt c/o sudden onset of left flank pain x 2 hrs ago

## 2016-11-08 NOTE — ED Notes (Signed)
Pt educated about not driving or performing critical tasks (such as operating heavy machinery or caring for an infant/toddler/child) while taking narcotics. Pt verbalized understanding.  

## 2016-11-09 DIAGNOSIS — N201 Calculus of ureter: Secondary | ICD-10-CM | POA: Diagnosis not present

## 2016-11-11 LAB — URINE CULTURE: Culture: 60000 — AB

## 2016-11-23 DIAGNOSIS — N201 Calculus of ureter: Secondary | ICD-10-CM | POA: Diagnosis not present

## 2016-11-25 ENCOUNTER — Other Ambulatory Visit: Payer: Self-pay | Admitting: Urology

## 2016-11-25 ENCOUNTER — Encounter (HOSPITAL_COMMUNITY): Payer: Self-pay | Admitting: *Deleted

## 2016-11-28 ENCOUNTER — Ambulatory Visit (HOSPITAL_COMMUNITY): Payer: 59

## 2016-11-28 ENCOUNTER — Encounter (HOSPITAL_COMMUNITY): Payer: Self-pay | Admitting: *Deleted

## 2016-11-28 ENCOUNTER — Ambulatory Visit (HOSPITAL_COMMUNITY)
Admission: RE | Admit: 2016-11-28 | Discharge: 2016-11-28 | Disposition: A | Discharge status: Home / Self Care | Payer: 59 | Source: Ambulatory Visit | Attending: Urology | Admitting: Urology

## 2016-11-28 ENCOUNTER — Encounter (HOSPITAL_COMMUNITY)
Admission: RE | Disposition: A | Discharge status: Home / Self Care | Payer: Self-pay | Source: Ambulatory Visit | Attending: Urology

## 2016-11-28 DIAGNOSIS — Z87442 Personal history of urinary calculi: Secondary | ICD-10-CM | POA: Insufficient documentation

## 2016-11-28 DIAGNOSIS — N3946 Mixed incontinence: Secondary | ICD-10-CM | POA: Insufficient documentation

## 2016-11-28 DIAGNOSIS — Z87891 Personal history of nicotine dependence: Secondary | ICD-10-CM | POA: Insufficient documentation

## 2016-11-28 DIAGNOSIS — N201 Calculus of ureter: Secondary | ICD-10-CM | POA: Diagnosis not present

## 2016-11-28 HISTORY — DX: Personal history of urinary calculi: Z87.442

## 2016-11-28 HISTORY — PX: EXTRACORPOREAL SHOCK WAVE LITHOTRIPSY: SHX1557

## 2016-11-28 LAB — PREGNANCY, URINE: Preg Test, Ur: NEGATIVE

## 2016-11-28 SURGERY — LITHOTRIPSY, ESWL
Anesthesia: LOCAL | Laterality: Left

## 2016-11-28 MED ORDER — OXYCODONE HCL 5 MG PO TABS
5.0000 mg | ORAL_TABLET | ORAL | Status: DC | PRN
  Administered 2016-11-28: 5 mg via ORAL
  Filled 2016-11-28: qty 1

## 2016-11-28 MED ORDER — DIAZEPAM 5 MG PO TABS
10.0000 mg | ORAL_TABLET | ORAL | Status: AC
  Administered 2016-11-28: 10 mg via ORAL
  Filled 2016-11-28: qty 2

## 2016-11-28 MED ORDER — CIPROFLOXACIN HCL 500 MG PO TABS
500.0000 mg | ORAL_TABLET | ORAL | Status: DC
  Filled 2016-11-28: qty 1

## 2016-11-28 MED ORDER — SODIUM CHLORIDE 0.9 % IV SOLN
INTRAVENOUS | Status: DC
  Administered 2016-11-28: 11:00:00 via INTRAVENOUS

## 2016-11-28 MED ORDER — DIPHENHYDRAMINE HCL 25 MG PO CAPS
25.0000 mg | ORAL_CAPSULE | ORAL | Status: AC
  Administered 2016-11-28: 25 mg via ORAL
  Filled 2016-11-28: qty 1

## 2016-11-28 NOTE — Discharge Instructions (Signed)
See [facility] discharge instructions in chart.  

## 2016-11-28 NOTE — Op Note (Signed)
See [facility] OP note scanned into chart. Also because of the size, density, location and other factors that cannot be anticipated I feel this will likely be a staged procedure. This fact supersedes any indication in the scanned [facility] operative note to the contrary.  

## 2016-11-28 NOTE — H&P (Signed)
I have ureteral stone.  HPI: Alexandra Edwards is a 36 year-old female established patient who is here for ureteral stone.  36 year old female with prior history of urolithiasis comes in today with a 5 day history of left flank pain. Recent CT scan revealed a proximal left ureteral stone. She is having not having fever, chills, nausea or vomiting. Minimal need for pain medicines.   11/23/16: She returns today for follow up. She continues to have some intermittent left flank pain, urgency, and frequency. She is also having some intermittent nausea. She states that her pain is fairly well controlled most of the time. She denies any dysuria, fever, or chills.   She first stated noticing pain on approximately 11/04/2016. She is currently having flank pain. She denies having back pain, groin pain, nausea, vomiting, fever, and chills. Pain is occuring on the left side. She has not caught a stone in her urine strainer since her symptoms began.     ALLERGIES: No Allergies    MEDICATIONS: Hydrocodone-Acetaminophen  Naproxen 500 mg tablet  Zofran 8 mg tablet     GU PSH: None     PSH Notes: Appendectomy, Bladder Surgery, Kidney Surgery, Cesarean Section   NON-GU PSH: Appendectomy - 2008 Cesarean Delivery Only - 2008    GU PMH: Ureteral calculus, Minimally symptomatic left proximal ureteral stone. - 11/09/2016 Abdominal Pain Unspec, Abdominal pain - 2014 Chronic cystitis (w/o hematuria), Chronic cystitis - 2014 Low back pain, Lower back pain - 2014 Other microscopic hematuria, Microscopic hematuria - 2014 Renal calculus, Nephrolithiasis - 2014 Stress Incontinence, Female stress incontinence - 2014 Urge incontinence, Urge incontinence of urine - 2014      PMH Notes:  2008-03-14 10:40:58 - Note: Normal Routine History And Physical Adult   NON-GU PMH: Constipation, unspecified, Constipation - 2014    FAMILY HISTORY: Colon Cancer - Uncle Diabetes - Runs In Family Family Health Status Number -  Runs In Family Pure Hypercholesterolemia - Runs In Family Uterine Cancer - Mother   SOCIAL HISTORY: Marital Status: Married Preferred Language: English; Ethnicity: Not Hispanic Or Latino; Race: White Current Smoking Status: Patient does not smoke anymore. Has not smoked since 11/02/2004. Smoked for 10 years. Smoked less than 1/2 pack per day.   Tobacco Use Assessment Completed: Used Tobacco in last 30 days? Drinks 2 drinks per month.  Drinks 1 caffeinated drink per day. Patient's occupation Engineer, drilling.    REVIEW OF SYSTEMS:    GU Review Female:   Patient reports frequent urination and leakage of urine. Patient denies hard to postpone urination, burning /pain with urination, get up at night to urinate, stream starts and stops, trouble starting your stream, have to strain to urinate, and being pregnant.  Gastrointestinal (Upper):   Patient denies vomiting, indigestion/ heartburn, and nausea.  Gastrointestinal (Lower):   Patient denies diarrhea and constipation.  Constitutional:   Patient denies fever, night sweats, weight loss, and fatigue.  Skin:   Patient denies skin rash/ lesion and itching.  Eyes:   Patient denies blurred vision and double vision.  Ears/ Nose/ Throat:   Patient denies sore throat and sinus problems.  Hematologic/Lymphatic:   Patient denies swollen glands and easy bruising.  Cardiovascular:   Patient denies leg swelling and chest pains.  Respiratory:   Patient denies cough and shortness of breath.  Endocrine:   Patient denies excessive thirst.  Musculoskeletal:   Patient denies back pain and joint pain.  Neurological:   Patient denies headaches and dizziness.  Psychologic:   Patient  denies depression and anxiety.   VITAL SIGNS:      11/23/2016 08:21 AM  Weight 200 lb / 90.72 kg  Height 62 in / 157.48 cm  BP 130/80 mmHg  Pulse 76 /min  Temperature 98.4 F / 36.8 C  BMI 36.6 kg/m   MULTI-SYSTEM PHYSICAL EXAMINATION:    Constitutional: Well-nourished. No  physical deformities. Normally developed. Good grooming.  Respiratory: Normal breath sounds. No labored breathing, no use of accessory muscles.   Cardiovascular: Regular rate and rhythm. No murmur, no gallop. Normal temperature, normal extremity pulses, no swelling, no varicosities.   Skin: No paleness, no jaundice, no cyanosis. No lesion, no ulcer, no rash.  Neurologic / Psychiatric: Oriented to time, oriented to place, oriented to person. No depression, no anxiety, no agitation.  Gastrointestinal: No mass, no tenderness, no rigidity, non obese abdomen.  Musculoskeletal: Spine, ribs, pelvis no bilateral tenderness. Normal gait and station of head and neck.     PAST DATA REVIEWED:  Source Of History:  Patient  Records Review:   Previous Patient Records  Urine Test Review:   Urinalysis  X-Ray Review: [facility] Protocol: Reviewed Films. Proximal left ureteral stone perhaps 9 x 5 mm. Significant hydronephrosis but no perinephric stranding. No other calculi on the left side noted. Hounsfield units 540, skin to stone distance 12.5 cm     PROCEDURES:         KUB - [id number]  A single view of the abdomen is obtained. Opacity noted within the confines of the right renal shadow. No obvious opacities noted within the left renal shadow. There still appears to be an opacity noted in the vicinity of the left proximal ureter consistent with stone noted on C.T imaging and KUB. There appears to be minimal progression. Bladder appears free of obstruction. IUD noted. No bony abnormalities. Normal bowel gas pattern.                Urinalysis w/Scope Dipstick Dipstick Cont'd Micro  Color: Yellow Bilirubin: Neg WBC/hpf: NS (Not Seen)  Appearance: Clear Ketones: Neg RBC/hpf: 3 - 10/hpf  Specific Gravity: 1.015 Blood: 1+ Bacteria: Rare (0-9/hpf)  pH: 7.5 Protein: Neg Cystals: NS (Not Seen)  Glucose: Neg Urobilinogen: 0.2 Casts: NS (Not Seen)    Nitrites: Neg Trichomonas: Not Present    Leukocyte Esterase: Neg  Mucous: Not Present      Epithelial Cells: 0 - 5/hpf      Yeast: NS (Not Seen)      Sperm: Not Present    ASSESSMENT:      ICD-10 Details  1 GU:   Ureteral calculus - N20.1    PLAN:           Orders Labs Urine Culture          Document Letter(s):  Created for Patient: Clinical Summary         Notes:   I will send urine for culture today. KUB today shows an opacity in the vicinity of the left proximal ureteral that is consistent with her stone noted on C.T imaging. Given that it appears to have progressed minimally, I do feel that she may be a good candidate for ESWL. We discussed indications, risks, and benefits in detail today including risk of bleeding, infection, hematoma, incomplete fragmentation and need for additional procedures. She voiced understanding. I will discuss case with her urologist and we will be in touch with those recommendations. Return precautions were given for fever or progressive pain, nausea, or vomiting.

## 2017-01-12 ENCOUNTER — Other Ambulatory Visit: Payer: Self-pay | Admitting: Urology

## 2017-01-16 ENCOUNTER — Encounter (HOSPITAL_COMMUNITY): Payer: Self-pay | Admitting: *Deleted

## 2017-01-19 ENCOUNTER — Encounter (HOSPITAL_COMMUNITY)
Admission: RE | Disposition: A | Discharge status: Home / Self Care | Payer: Self-pay | Source: Ambulatory Visit | Attending: Urology

## 2017-01-19 ENCOUNTER — Ambulatory Visit (HOSPITAL_COMMUNITY): Payer: 59

## 2017-01-19 ENCOUNTER — Ambulatory Visit (HOSPITAL_COMMUNITY)
Admission: RE | Admit: 2017-01-19 | Discharge: 2017-01-19 | Disposition: A | Discharge status: Home / Self Care | Payer: 59 | Source: Ambulatory Visit | Attending: Urology | Admitting: Urology

## 2017-01-19 ENCOUNTER — Encounter (HOSPITAL_COMMUNITY): Payer: Self-pay

## 2017-01-19 DIAGNOSIS — N201 Calculus of ureter: Secondary | ICD-10-CM | POA: Diagnosis not present

## 2017-01-19 HISTORY — PX: EXTRACORPOREAL SHOCK WAVE LITHOTRIPSY: SHX1557

## 2017-01-19 LAB — PREGNANCY, URINE: Preg Test, Ur: NEGATIVE

## 2017-01-19 SURGERY — LITHOTRIPSY, ESWL
Anesthesia: LOCAL | Laterality: Left

## 2017-01-19 MED ORDER — DIPHENHYDRAMINE HCL 25 MG PO CAPS
25.0000 mg | ORAL_CAPSULE | ORAL | Status: AC
  Administered 2017-01-19: 25 mg via ORAL
  Filled 2017-01-19: qty 1

## 2017-01-19 MED ORDER — CIPROFLOXACIN HCL 500 MG PO TABS
500.0000 mg | ORAL_TABLET | ORAL | Status: AC
  Administered 2017-01-19: 500 mg via ORAL
  Filled 2017-01-19: qty 1

## 2017-01-19 MED ORDER — DIAZEPAM 5 MG PO TABS
10.0000 mg | ORAL_TABLET | ORAL | Status: AC
  Administered 2017-01-19: 10 mg via ORAL
  Filled 2017-01-19: qty 2

## 2017-01-19 MED ORDER — SODIUM CHLORIDE 0.9 % IV SOLN
INTRAVENOUS | Status: DC
  Administered 2017-01-19: 11:00:00 via INTRAVENOUS

## 2017-01-19 NOTE — Discharge Instructions (Signed)
Lithotripsy, Care After °This sheet gives you information about how to care for yourself after your procedure. Your health care provider may also give you more specific instructions. If you have problems or questions, contact your health care provider. °What can I expect after the procedure? °After the procedure, it is common to have: °· Some blood in your urine. This should only last for a few days. °· Soreness in your back, sides, or upper abdomen for a few days. °· Blotches or bruises on your back where the pressure wave entered the skin. °· Pain, discomfort, or nausea when pieces (fragments) of the kidney stone move through the tube that carries urine from the kidney to the bladder (ureter). Stone fragments may pass soon after the procedure, but they may continue to pass for up to 4-8 weeks. °? If you have severe pain or nausea, contact your health care provider. This may be caused by a large stone that was not broken up, and this may mean that you need more treatment. °· Some pain or discomfort during urination. °· Some pain or discomfort in the lower abdomen or (in men) at the base of the penis. ° °Follow these instructions at home: °Medicines °· Take over-the-counter and prescription medicines only as told by your health care provider. °· If you were prescribed an antibiotic medicine, take it as told by your health care provider. Do not stop taking the antibiotic even if you start to feel better. °· Do not drive for 24 hours if you were given a medicine to help you relax (sedative). °· Do not drive or use heavy machinery while taking prescription pain medicine. °Eating and drinking °· Drink enough water and fluids to keep your urine clear or pale yellow. This helps any remaining pieces of the stone to pass. It can also help prevent new stones from forming. °· Eat plenty of fresh fruits and vegetables. °· Follow instructions from your health care provider about eating and drinking restrictions. You may be  instructed: °? To reduce how much salt (sodium) you eat or drink. Check ingredients and nutrition facts on packaged foods and beverages. °? To reduce how much meat you eat. °· Eat the recommended amount of calcium for your age and gender. Ask your health care provider how much calcium you should have. °General instructions °· Get plenty of rest. °· Most people can resume normal activities 1-2 days after the procedure. Ask your health care provider what activities are safe for you. °· If directed, strain all urine through the strainer that was provided by your health care provider. °? Keep all fragments for your health care provider to see. Any stones that are found may be sent to a medical lab for examination. The stone may be as small as a grain of salt. °· Keep all follow-up visits as told by your health care provider. This is important. °Contact a health care provider if: °· You have pain that is severe or does not get better with medicine. °· You have nausea that is severe or does not go away. °· You have blood in your urine longer than your health care provider told you to expect. °· You have more blood in your urine. °· You have pain during urination that does not go away. °· You urinate more frequently than usual and this does not go away. °· You develop a rash or any other possible signs of an allergic reaction. °Get help right away if: °· You have severe pain in   your back, sides, or upper abdomen. °· You have severe pain while urinating. °· Your urine is very dark red. °· You have blood in your stool (feces). °· You cannot pass any urine at all. °· You feel a strong urge to urinate after emptying your bladder. °· You have a fever or chills. °· You develop shortness of breath, difficulty breathing, or chest pain. °· You have severe nausea that leads to persistent vomiting. °· You faint. °Summary °· After this procedure, it is common to have some pain, discomfort, or nausea when pieces (fragments) of the  kidney stone move through the tube that carries urine from the kidney to the bladder (ureter). If this pain or nausea is severe, however, you should contact your health care provider. °· Most people can resume normal activities 1-2 days after the procedure. Ask your health care provider what activities are safe for you. °· Drink enough water and fluids to keep your urine clear or pale yellow. This helps any remaining pieces of the stone to pass, and it can help prevent new stones from forming. °· If directed, strain your urine and keep all fragments for your health care provider to see. Fragments or stones may be as small as a grain of salt. °· Get help right away if you have severe pain in your back, sides, or upper abdomen or have severe pain while urinating. °This information is not intended to replace advice given to you by your health care provider. Make sure you discuss any questions you have with your health care provider. °Document Released: 04/10/2007 Document Revised: 02/10/2016 Document Reviewed: 02/10/2016 °Elsevier Interactive Patient Education © 2017 [employer] ° °

## 2017-01-20 ENCOUNTER — Encounter (HOSPITAL_COMMUNITY): Payer: Self-pay | Admitting: Urology

## 2017-03-24 DIAGNOSIS — N200 Calculus of kidney: Secondary | ICD-10-CM | POA: Diagnosis not present

## 2017-05-08 LAB — HM PAP SMEAR
HM PAP: NEGATIVE
Pap: NEGATIVE

## 2017-05-10 ENCOUNTER — Ambulatory Visit: Payer: 59 | Admitting: Family Medicine

## 2017-05-15 ENCOUNTER — Ambulatory Visit (INDEPENDENT_AMBULATORY_CARE_PROVIDER_SITE_OTHER): Payer: 59 | Admitting: Family Medicine

## 2017-05-15 ENCOUNTER — Encounter: Payer: Self-pay | Admitting: Family Medicine

## 2017-05-15 VITALS — BP 103/71 | HR 78 | Temp 97.8°F | Ht 62.0 in | Wt 205.1 lb

## 2017-05-15 DIAGNOSIS — F320 Major depressive disorder, single episode, mild: Secondary | ICD-10-CM | POA: Diagnosis not present

## 2017-05-15 DIAGNOSIS — R5383 Other fatigue: Secondary | ICD-10-CM

## 2017-05-15 DIAGNOSIS — K219 Gastro-esophageal reflux disease without esophagitis: Secondary | ICD-10-CM | POA: Diagnosis not present

## 2017-05-15 DIAGNOSIS — E669 Obesity, unspecified: Secondary | ICD-10-CM | POA: Insufficient documentation

## 2017-05-15 DIAGNOSIS — Z87442 Personal history of urinary calculi: Secondary | ICD-10-CM | POA: Diagnosis not present

## 2017-05-15 DIAGNOSIS — Z23 Encounter for immunization: Secondary | ICD-10-CM

## 2017-05-15 LAB — RESULTS CONSOLE HPV: CHL HPV: NEGATIVE

## 2017-05-15 LAB — TSH: TSH: 1.88 u[IU]/mL (ref 0.35–4.50)

## 2017-05-15 MED ORDER — VENLAFAXINE HCL ER 37.5 MG PO CP24: ORAL_CAPSULE | ORAL | 0 refills | Status: DC

## 2017-05-15 MED ORDER — OMEPRAZOLE 40 MG PO CPDR: 40.0000 mg | DELAYED_RELEASE_CAPSULE | Freq: Every day | ORAL | 0 refills | Status: DC

## 2017-05-15 NOTE — Patient Instructions (Addendum)
omeprazole 40 mg a day --> 4 weeks. For stomach.  Start effexor taper as discussed. Follow up 4 weeks. Before needing refill. Never stop this medicine abruptly.  Track calories:   - https://www.calculator.net  - my fitness pal is app - 150 minutes  A week.    Food Choices for Gastroesophageal Reflux Disease, Adult When you have gastroesophageal reflux disease (GERD), the foods you eat and your eating habits are very important. Choosing the right foods can help ease your discomfort. What guidelines do I need to follow?  Choose fruits, vegetables, whole grains, and low-fat dairy products.  Choose low-fat meat, fish, and poultry.  Limit fats such as oils, salad dressings, butter, nuts, and avocado.  Keep a food diary. This helps you identify foods that cause symptoms.  Avoid foods that cause symptoms. These may be different for everyone.  Eat small meals often instead of 3 large meals a day.  Eat your meals slowly, in a place where you are relaxed.  Limit fried foods.  Cook foods using methods other than frying.  Avoid drinking alcohol.  Avoid drinking large amounts of liquids with your meals.  Avoid bending over or lying down until 2-3 hours after eating. What foods are not recommended? These are some foods and drinks that may make your symptoms worse: Vegetables Tomatoes. Tomato juice. Tomato and spaghetti sauce. Chili peppers. Onion and garlic. Horseradish. Fruits Oranges, grapefruit, and lemon (fruit and juice). Meats High-fat meats, fish, and poultry. This includes hot dogs, ribs, ham, sausage, salami, and bacon. Dairy Whole milk and chocolate milk. Sour cream. Cream. Butter. Ice cream. Cream cheese. Drinks Coffee and tea. Bubbly (carbonated) drinks or energy drinks. Condiments Hot sauce. Barbecue sauce. Sweets/Desserts Chocolate and cocoa. Donuts. Peppermint and spearmint. Fats and Oils High-fat foods. This includes [place] fries and potato  chips. Other Vinegar. Strong spices. This includes black pepper, white pepper, red pepper, cayenne, curry powder, cloves, ginger, and chili powder. The items listed above may not be a complete list of foods and drinks to avoid. Contact your dietitian for more information. This information is not intended to replace advice given to you by your health care provider. Make sure you discuss any questions you have with your health care provider. Document Released: 09/20/2011 Document Revised: 08/27/2015 Document Reviewed: 01/23/2013 Elsevier Interactive Patient Education  2017 [employer]   Please help [place] help you:  We are honored you have chosen [facility] for your Primary Care home. Below you will find basic instructions that you may need to access in the future. Please help [place] help you by reading the instructions, which cover many of the frequent questions we experience.   Prescription refills and request:  -In order to allow more efficient response time, please call your pharmacy for all refills. They will forward the request electronically to [place]. This allows for the quickest possible response. Request left on a nurse line can take longer to refill, since these are checked as time allows between office patients and other phone calls.  - refill request can take up to 3-5 working days to complete.  - If request is sent electronically and request is appropiate, it is usually completed in 1-2 business days.  - all patients will need to be seen routinely for all chronic medical conditions requiring prescription medications (see follow-up below). If you are overdue for follow up on your condition, you will be asked to make an appointment and we will call in enough medication to cover you until  your appointment (up to 30 days).  - all controlled substances will require a face to face visit to request/refill.  - if you desire your prescriptions to go through a new pharmacy, and have an active  script at original pharmacy, you will need to call your pharmacy and have scripts transferred to new pharmacy. This is completed between the pharmacy locations and not by your provider.    Results: If any images or labs were ordered, it can take up to 1 week to get results depending on the test ordered and the lab/facility running and resulting the test. - Normal or stable results, which do not need further discussion, may be released to your mychart immediately with attached note to you. A call may not be generated for normal results. Please make certain to sign up for mychart. If you have questions on how to activate your mychart you can call the front office.  - If your results need further discussion, our office will attempt to contact you via phone, and if unable to reach you after 2 attempts, we will release your abnormal result to your mychart with instructions.  - All results will be automatically released in mychart after 1 week.  - Your provider will provide you with explanation and instruction on all relevant material in your results. Please keep in mind, results and labs may appear confusing or abnormal to the untrained eye, but it does not mean they are actually abnormal for you personally. If you have any questions about your results that are not covered, or you desire more detailed explanation than what was provided, you should make an appointment with your provider to do so.   Our office handles many outgoing and incoming calls daily. If we have not contacted you within 1 week about your results, please check your mychart to see if there is a message first and if not, then contact our office.  In helping with this matter, you help decrease call volume, and therefore allow us to be able to respond to patients needs more efficiently.   Acute office visits (sick visit):  An acute visit is intended for a new problem and are scheduled in shorter time slots to allow schedule openings for  patients with new problems. This is the appropriate visit to discuss a new problem. In order to provide you with excellent quality medical care with proper time for you to explain your problem, have an exam and receive treatment with instructions, these appointments should be limited to one new problem per visit. If you experience a new problem, in which you desire to be addressed, please make an acute office visit, we save openings on the schedule to accommodate you. Please do not save your new problem for any other type of visit, let us take care of it properly and quickly for you.   Follow up visits:  Depending on your condition(s) your provider will need to see you routinely in order to provide you with quality care and prescribe medication(s). Most chronic conditions (Example: hypertension, Diabetes, depression/anxiety... etc), require visits a couple times a year. Your provider will instruct you on proper follow up for your personal medical conditions and history. Please make certain to make follow up appointments for your condition as instructed. Failing to do so could result in lapse in your medication treatment/refills. If you request a refill, and are overdue to be seen on a condition, we will always provide you with a 30 day script (once) to allow  you time to schedule.    Medicare wellness (well visit): - we have a wonderful Nurse Selena Batten), that will meet with you and provide you will yearly medicare wellness visits. These visits should occur yearly (can not be scheduled less than 1 calendar year apart) and cover preventive health, immunizations, advance directives and screenings you are entitled to yearly through your medicare benefits. Do not miss out on your entitled benefits, this is when medicare will pay for these benefits to be ordered for you.  These are strongly encouraged by your provider and is the appropriate type of visit to make certain you are up to date with all preventive health  benefits. If you have not had your medicare wellness exam in the last 12 months, please make certain to schedule one by calling the office and schedule your medicare wellness with Selena Batten as soon as possible.   Yearly physical (well visit):  - Adults are recommended to be seen yearly for physicals. Check with your insurance and date of your last physical, most insurances require one calendar year between physicals. Physicals include all preventive health topics, screenings, medical exam and labs that are appropriate for gender/age and history. You may have fasting labs needed at this visit. This is a well visit (not a sick visit), new problems should not be covered during this visit (see acute visit).  - Pediatric patients are seen more frequently when they are younger. Your provider will advise you on well child visit timing that is appropriate for your their age. - This is not a medicare wellness visit. Medicare wellness exams do not have an exam portion to the visit. Some medicare companies allow for a physical, some do not allow a yearly physical. If your medicare allows a yearly physical you can schedule the medicare wellness with our nurse Selena Batten and have your physical with your provider after, on the same day. Please check with insurance for your full benefits.   Late Policy/No Shows:  - all new patients should arrive 15-30 minutes earlier than appointment to allow [place] time  to  obtain all personal demographics,  insurance information and for you to complete office paperwork. - All established patients should arrive 10-15 minutes earlier than appointment time to update all information and be checked in .  - In our best efforts to run on time, if you are late for your appointment you will be asked to either reschedule or if able, we will work you back into the schedule. There will be a wait time to work you back in the schedule,  depending on availability.  - If you are unable to make it to your appointment  as scheduled, please call 24 hours ahead of time to allow [place] to fill the time slot with someone else who needs to be seen. If you do not cancel your appointment ahead of time, you may be charged a no show fee.

## 2017-05-15 NOTE — Progress Notes (Signed)
Patient ID: Alexandra Edwards, female  DOB: [birth year], 37 y.o.   MRN: [id number] Patient Care Team    Relationship Specialty Notifications Start End  Natalia Leatherwood, DO PCP - General Family Medicine  05/15/17   Pa, [facility]    05/15/17     Chief Complaint  Patient presents with  . Establish Care    pt c/o complaining of heart burn X 1year and she is also concern about her weight.    Subjective:  Alexandra Edwards is a 37 y.o.  female present for new patient establishment. All past medical history, surgical history, allergies, family history, immunizations, medications and social history were obtained and entered in the electronic medical record today. All recent labs, ED visits and hospitalizations within the last year were reviewed.  Heartburn: Patient reports more frequent heartburn over the last year. Over the last week she has noticed it's worsened in frequency and occurs greater than 4 times a week. It seems to be mostly reflective after drinking coffee in the morning on an empty stomach. She endorses a cough that occurs when eating. She denies hoarseness, chronic dry cough, sour taste in her throat. She has never been on medication for GERD in the past.  Depression/anxiety/weight gain: Patient reports she has been steadily gaining weight over the last few years. At one time she was on a medication to help her lose weight, however after she stopped medication she gained more than she initially lost. She is upset about her weight gain but states she cannot seem to get motivated enough to go to the gym. She feels increased fatigue. She has been married for 18 years in the last 3 years have been rather difficult. She states they are "indifferent "to each other. They sleep in separate rooms. They have not been intimate in over a year and a half. They have 2 sons in high school. They have talked about considering a divorce.  Kidney stones: Patient has a significant  medical history of recurrent kidney stones with lithotripsy and ureteral stent. She is established with [facility].  Depression screen PHQ 2/9 05/15/2017  Decreased Interest 1  Down, Depressed, Hopeless 2  PHQ - 2 Score 3  Altered sleeping 0  Tired, decreased energy 2  Change in appetite 1  Feeling bad or failure about yourself  3  Trouble concentrating 0  Moving slowly or fidgety/restless 0  Suicidal thoughts 1  PHQ-9 Score 10   GAD 7 : Generalized Anxiety Score 05/15/2017  Nervous, Anxious, on Edge 2  Control/stop worrying 0  Worry too much - different things 1  Trouble relaxing 0  Restless 1  Easily annoyed or irritable 2  Afraid - awful might happen 1  Total GAD 7 Score 7    Current Exercise Habits: The patient does not participate in regular exercise at present   Fall Risk  05/15/2017  Falls in the past year? No   Immunization History  Administered Date(s) Administered  . Influenza,inj,Quad PF,6+ Mos 05/15/2017    No exam data present  Past Medical History:  Diagnosis Date  . Chicken pox   . Frequent urinary tract infections   . GERD (gastroesophageal reflux disease)   . History of kidney stones   . Migraine   . Urinary incontinence    No Known Allergies Past Surgical History:  Procedure Laterality Date  . APPENDECTOMY  2009  . CESAREAN SECTION  2001, 2003  . EXTRACORPOREAL SHOCK WAVE LITHOTRIPSY Left 11/28/2016  Procedure: EXTRACORPOREAL SHOCK WAVE LITHOTRIPSY (ESWL);  Surgeon: Crist FatHerrick, Benjamin W, MD;  Location: [facility];  Service: Urology;  Laterality: Left;  [phone];  [phone] [id number]  . EXTRACORPOREAL SHOCK WAVE LITHOTRIPSY Left 01/19/2017   Procedure: LEFT EXTRACORPOREAL SHOCK WAVE LITHOTRIPSY (ESWL);  Surgeon: Malen GauzeMcKenzie, Patrick L, MD;  Location: [facility];  Service: Urology;  Laterality: Left;  . surgery to remove bladder polyps     2002  . URETERAL STENT PLACEMENT Left 2002  . urinary stent placement      secondary to kidney  stones   Family History  Problem Relation Age of Onset  . Hyperlipidemia Mother   . Uterine cancer Mother   . Asthma Son   . Diabetes Maternal Grandfather   . Ulcers Paternal Grandmother   . Arthritis Paternal Grandmother   . Depression Paternal Grandmother    Social History   Socioeconomic History  . Marital status: Married    Spouse name: Not on file  . Number of children: 2  . Years of education: Not on file  . Highest education level: Not on file  Social Needs  . Financial resource strain: Not on file  . Food insecurity - worry: Not on file  . Food insecurity - inability: Not on file  . Transportation needs - medical: Not on file  . Transportation needs - non-medical: Not on file  Occupational History  . Occupation: Accoiunting  Tobacco Use  . Smoking status: Former Smoker    Packs/day: 0.25    Years: 6.00    Pack years: 1.50    Types: Cigarettes  . Smokeless tobacco: Never Used  Substance and Sexual Activity  . Alcohol use: No    Comment: socially  . Drug use: No  . Sexual activity: Yes    Partners: Male    Birth control/protection: IUD  Other Topics Concern  . Not on file  Social History Narrative   Alexandra Edwards lives w/husband & 2 sons. [place]   College-educated. 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   Former smoker.   Drinks caffeine.   Smoke lamina home.   Wears her seatbelt.   Feels safe in her relationships.   Allergies as of 05/15/2017   No Known Allergies     Medication List        Accurate as of 05/15/17 12:37 PM. Always use your most recent med list.          omeprazole 40 MG capsule Commonly known as:  PRILOSEC Take 1 capsule (40 mg total) by mouth daily.   oxybutynin 5 MG tablet Commonly known as:  DITROPAN Take 5 mg by mouth once.   venlafaxine XR 37.5 MG 24 hr capsule Commonly known as:  EFFEXOR-XR Take 1 capsule (37.5 mg total) by mouth daily with breakfast for 7 days, THEN 2 capsules (75 mg total)  daily with breakfast for 23 days. Start taking on:  05/15/2017       All past medical history, surgical history, allergies, family history, immunizations andmedications were updated in the EMR today and reviewed under the history and medication portions of their EMR.    No results found for this or any previous visit (from the past 2160 hour(s)).  Dg Abd 1 View  Result Date: 01/19/2017 CLINICAL DATA:  LEFT ureteral calculus, pre lithotripsy EXAM: ABDOMEN - 1 VIEW COMPARISON:  01/10/2017 FINDINGS: IUD projects over pelvis. Question calculus projecting over inferior pole of RIGHT kidney 7 mm diameter versus bowel artifact. No definite LEFT-sided ureteral  calculus visualized. No additional urinary tract calcifications. Bowel gas pattern normal. Osseous structures unremarkable. IMPRESSION: Question 7 mm RIGHT renal calculus versus bowel artifact. No definite LEFT ureteral calculus visualized. Electronically Signed   By: Ulyses Southward M.D.   On: 01/19/2017 10:59     ROS: 14 pt review of systems performed and negative (unless mentioned in an HPI)  Objective: BP 103/71 (BP Location: Left Arm, Patient Position: Sitting, Cuff Size: Large)   Pulse 78   Temp 97.8 F (36.6 C) (Oral)   Ht 5\' 2"  (1.575 m)   Wt 205 lb 1.9 oz (93 kg)   SpO2 98%   BMI 37.52 kg/m  Gen: Afebrile. No acute distress. Nontoxic in appearance, well-developed, well-nourished,  obese. HENT: AT. [place].  MMM, no oral lesions, adequate dentition. Bilateral nares within normal limits. Throat without erythema, ulcerations or exudates. No Cough on exam, no hoarseness on exam. Eyes:Pupils Equal Round Reactive to light, Extraocular movements intact,  Conjunctiva without redness, discharge or icterus. Neck/lymp/endocrine: Supple, no lymphadenopathy, no thyromegaly CV: RRR no murmur, no edema, +2/4 P posterior tibialis pulses.  Chest: CTAB, no wheeze, rhonchi or crackles.  Skin:  Warm and well-perfused. Skin intact. Neuro/Msk:  Normal  gait. PERLA. EOMi. Alert. Oriented x3.   Psych: Tearful, otherwise Normal affect, dress and demeanor. Normal speech. Normal thought content and judgment.   Assessment/plan: Alexandra Edwards is a 37 y.o. female present for establish care Influenza vaccine administered - Flu Vaccine QUAD 6+ mos PF IM (Fluarix Quad PF)  Depression, major, single episode, mild (HCC)/fatigue/weight gain/obesity - new. Lengthy discussion today on counseling and medications. Patient is agreeable to both. Psychology referral placed. - Start Effexor taper. - Discussed diet and exercise. Start counting her calories using the calorie calculate her.neck as her guide of daily caloric intake need. - Exercise greater than 150 minutes a week. - TSH - Ambulatory referral to Psychology - CBC with Differential/Platelet  Gastroesophageal reflux disease, esophagitis presence not specified - New. Discuss current diet with patient. Start Prilosec. - Follow-up 4 weeks  History of kidney stones Continue routine follow-up with urology  Return in about 4 weeks (around 06/12/2017) for depression/GERD.     Note is dictated utilizing voice recognition software. Although note has been proof read prior to signing, occasional typographical errors still can be missed. If any questions arise, please do not hesitate to call for verification.  Electronically signed by: Felix Pacini, DO [facility]

## 2017-05-16 LAB — CBC WITH DIFFERENTIAL/PLATELET
BASOS ABS: 20 {cells}/uL (ref 0–200)
BASOS PCT: 0.4 %
EOS PCT: 2 %
Eosinophils Absolute: 100 cells/uL (ref 15–500)
HEMATOCRIT: 43.1 % (ref 35.0–45.0)
HEMOGLOBIN: 14.8 g/dL (ref 11.7–15.5)
LYMPHS ABS: 1785 {cells}/uL (ref 850–3900)
MCH: 28.7 pg (ref 27.0–33.0)
MCHC: 34.3 g/dL (ref 32.0–36.0)
MCV: 83.5 fL (ref 80.0–100.0)
MONOS PCT: 6 %
MPV: 9.9 fL (ref 7.5–12.5)
NEUTROS ABS: 2795 {cells}/uL (ref 1500–7800)
Neutrophils Relative %: 55.9 %
Platelets: 381 10*3/uL (ref 140–400)
RBC: 5.16 10*6/uL — AB (ref 3.80–5.10)
RDW: 12.8 % (ref 11.0–15.0)
Total Lymphocyte: 35.7 %
WBC mixed population: 300 cells/uL (ref 200–950)
WBC: 5 10*3/uL (ref 3.8–10.8)

## 2017-06-09 ENCOUNTER — Encounter: Payer: Self-pay | Admitting: Family Medicine

## 2017-06-09 ENCOUNTER — Ambulatory Visit (INDEPENDENT_AMBULATORY_CARE_PROVIDER_SITE_OTHER): Payer: 59 | Admitting: Family Medicine

## 2017-06-09 VITALS — BP 108/64 | HR 84 | Temp 98.0°F | Resp 20 | Ht 62.0 in | Wt 205.8 lb

## 2017-06-09 DIAGNOSIS — F320 Major depressive disorder, single episode, mild: Secondary | ICD-10-CM | POA: Diagnosis not present

## 2017-06-09 DIAGNOSIS — K219 Gastro-esophageal reflux disease without esophagitis: Secondary | ICD-10-CM | POA: Diagnosis not present

## 2017-06-09 MED ORDER — VENLAFAXINE HCL ER 75 MG PO CP24: 75.0000 mg | ORAL_CAPSULE | Freq: Every day | ORAL | 1 refills | Status: DC

## 2017-06-09 MED ORDER — OMEPRAZOLE 40 MG PO CPDR: 40.0000 mg | DELAYED_RELEASE_CAPSULE | Freq: Every day | ORAL | 0 refills | Status: DC

## 2017-06-09 NOTE — Patient Instructions (Signed)
I have refilled your effexor at the 75 mg dose. Take 1 pill in the morning daily.  Follow in 6 months.   With the prilosec, take for a another 2 months, then try to stop or take every other day if needed.    I am glad you are feeling better.   Please help us help you:  We are [facility]. Below you will find basic instructions that you may need to access in the future. Please help us help you by reading the instructions, which cover many of the frequent questions we experience.   Prescription refills and request:  -In order to allow more efficient response time, please call your pharmacy for all refills. They will forward the request electronically to us. This allows for the quickest possible response. Request left on a nurse line can take longer to refill, since these are checked as time allows between office patients and other phone calls.  - refill request can take up to 3-5 working days to complete.  - If request is sent electronically and request is appropiate, it is usually completed in 1-2 business days.  - all patients will need to be seen routinely for all chronic medical conditions requiring prescription medications (see follow-up below). If you are overdue for follow up on your condition, you will be asked to make an appointment and we will call in enough medication to cover you until your appointment (up to 30 days).  - all controlled substances will require a face to face visit to request/refill.  - if you desire your prescriptions to go through a new pharmacy, and have an active script at original pharmacy, you will need to call your pharmacy and have scripts transferred to new pharmacy. This is completed between the pharmacy locations and not by your provider.    Results: If any images or labs were ordered, it can take up to 1 week to get results depending on the test ordered and the lab/facility running and resulting the  test. - Normal or stable results, which do not need further discussion, may be released to your mychart immediately with attached note to you. A call may not be generated for normal results. Please make certain to sign up for mychart. If you have questions on how to activate your mychart you can call the front office.  - If your results need further discussion, our office will attempt to contact you via phone, and if unable to reach you after 2 attempts, we will release your abnormal result to your mychart with instructions.  - All results will be automatically released in mychart after 1 week.  - Your provider will provide you with explanation and instruction on all relevant material in your results. Please keep in mind, results and labs may appear confusing or abnormal to the untrained eye, but it does not mean they are actually abnormal for you personally. If you have any questions about your results that are not covered, or you desire more detailed explanation than what was provided, you should make an appointment with your provider to do so.   Our office handles many outgoing and incoming calls daily. If we have not contacted you within 1 week about your results, please check your mychart to see if there is a message first and if not, then contact our office.  In helping with this matter, you help decrease call volume, and therefore allow us to be able  to respond to patients needs more efficiently.   Acute office visits (sick visit):  An acute visit is intended for a new problem and are scheduled in shorter time slots to allow schedule openings for patients with new problems. This is the appropriate visit to discuss a new problem. In order to provide you with excellent quality medical care with proper time for you to explain your problem, have an exam and receive treatment with instructions, these appointments should be limited to one new problem per visit. If you experience a new problem, in which you  desire to be addressed, please make an acute office visit, we save openings on the schedule to accommodate you. Please do not save your new problem for any other type of visit, let us take care of it properly and quickly for you.   Follow up visits:  Depending on your condition(s) your provider will need to see you routinely in order to provide you with quality care and prescribe medication(s). Most chronic conditions (Example: hypertension, Diabetes, depression/anxiety... etc), require visits a couple times a year. Your provider will instruct you on proper follow up for your personal medical conditions and history. Please make certain to make follow up appointments for your condition as instructed. Failing to do so could result in lapse in your medication treatment/refills. If you request a refill, and are overdue to be seen on a condition, we will always provide you with a 30 day script (once) to allow you time to schedule.    Medicare wellness (well visit): - we have a wonderful Nurse Maudie Mercury), that will meet with you and provide you will yearly medicare wellness visits. These visits should occur yearly (can not be scheduled less than 1 calendar year apart) and cover preventive health, immunizations, advance directives and screenings you are entitled to yearly through your medicare benefits. Do not miss out on your entitled benefits, this is when medicare will pay for these benefits to be ordered for you.  These are strongly encouraged by your provider and is the appropriate type of visit to make certain you are up to date with all preventive health benefits. If you have not had your medicare wellness exam in the last 12 months, please make certain to schedule one by calling the office and schedule your medicare wellness with Maudie Mercury as soon as possible.   Yearly physical (well visit):  - Adults are recommended to be seen yearly for physicals. Check with your insurance and date of your last physical, most  insurances require one calendar year between physicals. Physicals include all preventive health topics, screenings, medical exam and labs that are appropriate for gender/age and history. You may have fasting labs needed at this visit. This is a well visit (not a sick visit), new problems should not be covered during this visit (see acute visit).  - Pediatric patients are seen more frequently when they are younger. Your provider will advise you on well child visit timing that is appropriate for your their age. - This is not a medicare wellness visit. Medicare wellness exams do not have an exam portion to the visit. Some medicare companies allow for a physical, some do not allow a yearly physical. If your medicare allows a yearly physical you can schedule the medicare wellness with our nurse Maudie Mercury and have your physical with your provider after, on the same day. Please check with insurance for your full benefits.   Late Policy/No Shows:  - all new patients should arrive 15-30 minutes  earlier than appointment to allow [place] time  to  obtain all personal demographics,  insurance information and for you to complete office paperwork. - All established patients should arrive 10-15 minutes earlier than appointment time to update all information and be checked in .  - In our best efforts to run on time, if you are late for your appointment you will be asked to either reschedule or if able, we will work you back into the schedule. There will be a wait time to work you back in the schedule,  depending on availability.  - If you are unable to make it to your appointment as scheduled, please call 24 hours ahead of time to allow [place] to fill the time slot with someone else who needs to be seen. If you do not cancel your appointment ahead of time, you may be charged a no show fee.

## 2017-06-09 NOTE — Progress Notes (Signed)
Patient ID: Alexandra Edwards, female  DOB: 08-06-1980, 37 y.o.   MRN: [id number] Patient Care Team    Relationship Specialty Notifications Start End  Natalia Leatherwood, DO PCP - General Family Medicine  05/15/17   Pa, [facility]    05/15/17     Chief Complaint  Patient presents with  . Depression    Subjective:  Alexandra Edwards is a 37 y.o.  female present for follow up.  Heartburn: Patient reports her heartburn and epigastric discomfort is greatly improved since starting Prilosec 40 mg daily. She has been on this medication for 4 weeks, no notable side effects. Prior note:  Patient reports more frequent heartburn over the last year. Over the last week she has noticed it's worsened in frequency and occurs greater than 4 times a week. It seems to be mostly reflective after drinking coffee in the morning on an empty stomach. She endorses a cough that occurs when eating. She denies hoarseness, chronic dry cough, sour taste in her throat. She has never been on medication for GERD in the past.  Depression/anxiety/weight gain: Patient reports her depression and anxiety is much improved on Effexor 75 mg daily. She denies any notable side effects to medication. She feels her depression and her anxiety is much more tolerable without having blunted emotions on medication. Prior note:  Patient reports she has been steadily gaining weight over the last few years. At one time she was on a medication to help her lose weight, however after she stopped medication she gained more than she initially lost. She is upset about her weight gain but states she cannot seem to get motivated enough to go to the gym. She feels increased fatigue. She has been married for 18 years in the last 3 years have been rather difficult. She states they are "indifferent "to each other. They sleep in separate rooms. They have not been intimate in over a year and a half. They have 2 sons in high school. They have  talked about considering a divorce.    Depression screen [facility] 2/9 06/09/2017 05/15/2017  Decreased Interest 0 1  Down, Depressed, Hopeless 0 2  PHQ - 2 Score 0 3  Altered sleeping 0 0  Tired, decreased energy 1 2  Change in appetite 1 1  Feeling bad or failure about yourself  0 3  Trouble concentrating 0 0  Moving slowly or fidgety/restless 0 0  Suicidal thoughts 0 1  PHQ-9 Score 2 10   GAD 7 : Generalized Anxiety Score 06/09/2017 05/15/2017  Nervous, Anxious, on Edge 0 2  Control/stop worrying 0 0  Worry too much - different things 0 1  Trouble relaxing 0 0  Restless 0 1  Easily annoyed or irritable 0 2  Afraid - awful might happen 0 1  Total GAD 7 Score 0 7        Fall Risk  05/15/2017  Falls in the past year? No   Immunization History  Administered Date(s) Administered  . Influenza,inj,Quad PF,6+ Mos 05/15/2017    No exam data present  Past Medical History:  Diagnosis Date  . Chicken pox   . Frequent urinary tract infections   . GERD (gastroesophageal reflux disease)   . History of kidney stones   . Migraine   . Urinary incontinence    No Known Allergies Past Surgical History:  Procedure Laterality Date  . APPENDECTOMY  2009  . CESAREAN SECTION  2001, 2003  . EXTRACORPOREAL SHOCK WAVE  LITHOTRIPSY Left 11/28/2016   Procedure: EXTRACORPOREAL SHOCK WAVE LITHOTRIPSY (ESWL);  Surgeon: Crist Fat, MD;  Location: [facility];  Service: Urology;  Laterality: Left;  [phone] [id number]  . EXTRACORPOREAL SHOCK WAVE LITHOTRIPSY Left 01/19/2017   Procedure: LEFT EXTRACORPOREAL SHOCK WAVE LITHOTRIPSY (ESWL);  Surgeon: Malen Gauze, MD;  Location: [facility];  Service: Urology;  Laterality: Left;  . surgery to remove bladder polyps     2002  . URETERAL STENT PLACEMENT Left 2002  . urinary stent placement      secondary to kidney stones   Family History  Problem Relation Age of Onset  . Hyperlipidemia Mother   . Uterine cancer Mother   .  Asthma Son   . Diabetes Maternal Grandfather   . Ulcers Paternal Grandmother   . Arthritis Paternal Grandmother   . Depression Paternal Grandmother    Social History   Socioeconomic History  . Marital status: Married    Spouse name: Not on file  . Number of children: 2  . Years of education: Not on file  . Highest education level: Not on file  Social Needs  . Financial resource strain: Not on file  . Food insecurity - worry: Not on file  . Food insecurity - inability: Not on file  . Transportation needs - medical: Not on file  . Transportation needs - non-medical: Not on file  Occupational History  . Occupation: Accoiunting  Tobacco Use  . Smoking status: Former Smoker    Packs/day: 0.25    Years: 6.00    Pack years: 1.50    Types: Cigarettes  . Smokeless tobacco: Never Used  Substance and Sexual Activity  . Alcohol use: No    Comment: socially  . Drug use: No  . Sexual activity: Yes    Partners: Male    Birth control/protection: IUD  Other Topics Concern  . Not on file  Social History Narrative   Ms Theys lives w/husband & 2 sons. She relocated from [place].   College-educated. 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   Former smoker.   Drinks caffeine.   Smoke lamina home.   Wears her seatbelt.   Feels safe in her relationships.   Allergies as of 06/09/2017   No Known Allergies     Medication List        Accurate as of 06/09/17  9:11 AM. Always use your most recent med list.          omeprazole 40 MG capsule Commonly known as:  PRILOSEC Take 1 capsule (40 mg total) by mouth daily.   oxybutynin 5 MG tablet Commonly known as:  DITROPAN Take 5 mg by mouth once.   venlafaxine XR 37.5 MG 24 hr capsule Commonly known as:  EFFEXOR-XR Take 1 capsule (37.5 mg total) by mouth daily with breakfast for 7 days, THEN 2 capsules (75 mg total) daily with breakfast for 23 days. Start taking on:  05/15/2017       All past medical history, surgical history,  allergies, family history, immunizations andmedications were updated in the EMR today and reviewed under the history and medication portions of their EMR.    Recent Results (from the past 2160 hour(s))  TSH     Status: None   Collection Time: 05/15/17 10:10 AM  Result Value Ref Range   TSH 1.88 0.35 - 4.50 uIU/mL  CBC with Differential/Platelet     Status: Abnormal   Collection Time: 05/15/17 12:03 PM  Result Value Ref Range  WBC 5.0 3.8 - 10.8 Thousand/uL   RBC 5.16 (H) 3.80 - 5.10 Million/uL   Hemoglobin 14.8 11.7 - 15.5 g/dL   HCT 16.143.1 09.635.0 - 04.545.0 %   MCV 83.5 80.0 - 100.0 fL   MCH 28.7 27.0 - 33.0 pg   MCHC 34.3 32.0 - 36.0 g/dL   RDW 40.912.8 81.111.0 - 91.415.0 %   Platelets 381 140 - 400 Thousand/uL   MPV 9.9 7.5 - 12.5 fL   Neutro Abs 2,795 1,500 - 7,800 cells/uL   Lymphs Abs 1,785 850 - 3,900 cells/uL   WBC mixed population 300 200 - 950 cells/uL   Eosinophils Absolute 100 15 - 500 cells/uL   Basophils Absolute 20 0 - 200 cells/uL   Neutrophils Relative % 55.9 %   Total Lymphocyte 35.7 %   Monocytes Relative 6.0 %   Eosinophils Relative 2.0 %   Basophils Relative 0.4 %    Dg Abd 1 View  Result Date: 01/19/2017 CLINICAL DATA:  LEFT ureteral calculus, pre lithotripsy EXAM: ABDOMEN - 1 VIEW COMPARISON:  01/10/2017 FINDINGS: IUD projects over pelvis. Question calculus projecting over inferior pole of RIGHT kidney 7 mm diameter versus bowel artifact. No definite LEFT-sided ureteral calculus visualized. No additional urinary tract calcifications. Bowel gas pattern normal. Osseous structures unremarkable. IMPRESSION: Question 7 mm RIGHT renal calculus versus bowel artifact. No definite LEFT ureteral calculus visualized. Electronically Signed   By: Ulyses SouthwardMark  Boles M.D.   On: 01/19/2017 10:59     ROS: 14 pt review of systems performed and negative (unless mentioned in an HPI)  Objective: BP 108/64 (BP Location: Left Arm, Patient Position: Sitting, Cuff Size: Normal)   Pulse 84   Temp 98  F (36.7 C)   Resp 20   Ht 5\' 2"  (1.575 m)   Wt 205 lb 12 oz (93.3 kg)   SpO2 98%   BMI 37.63 kg/m  Gen: Afebrile. No acute distress. No acute distress, nontoxic in presentation, very pleasant Caucasian female. HENT: AT. [place].  MMM.  Eyes:Pupils Equal Round Reactive to light, Extraocular movements intact,  Conjunctiva without redness, discharge or icterus. CV: RRR  Abd: Soft. NTND. BS present.   Neuro:  Normal gait. PERLA. EOMi. Alert. Oriented x3  Psych: Normal affect, dress and demeanor. Normal speech. Normal thought content and judgment.   Assessment/plan: Alexandra Edwards is a 37 y.o. female present for establish care Depression, major, single episode, mild (HCC)/fatigue/weight gain/obesity -Doing well with start of Effexor. Continue Effexor 75 mg daily. Refills provided today for total 6 months. - Discussed diet and exercise. Start counting her calories using the calorie calculator as her guide of daily caloric intake need. - Exercise greater than 150 minutes a week. - Ambulatory referral to Psychology (April 1st - 1st appt) - Follow-up in 6 months, unless needed sooner   Gastroesophageal reflux disease, esophagitis presence not specified - Symptoms resolve with Prilosec 40 mg daily. Extended course for full 12 weeks. Then patient encouraged to try to discontinue medication. If she finds her symptoms return she can call and we would happily refill her medication at the lower dose.    No Follow-up on file.     Note is dictated utilizing voice recognition software. Although note has been proof read prior to signing, occasional typographical errors still can be missed. If any questions arise, please do not hesitate to call for verification.  Electronically signed by: Felix Pacinienee Catrice Zuleta, DO [facility] [place]

## 2017-06-15 ENCOUNTER — Encounter: Payer: Self-pay | Admitting: Family Medicine

## 2017-07-03 ENCOUNTER — Ambulatory Visit: Payer: Self-pay | Admitting: Clinical

## 2017-08-21 ENCOUNTER — Encounter: Payer: Self-pay | Admitting: Family Medicine

## 2017-08-21 ENCOUNTER — Telehealth: Payer: Self-pay | Admitting: Family Medicine

## 2017-08-21 ENCOUNTER — Ambulatory Visit (INDEPENDENT_AMBULATORY_CARE_PROVIDER_SITE_OTHER): Payer: 59 | Admitting: Family Medicine

## 2017-08-21 VITALS — BP 111/79 | HR 80 | Temp 98.1°F | Resp 20 | Ht 62.0 in | Wt 199.0 lb

## 2017-08-21 DIAGNOSIS — R591 Generalized enlarged lymph nodes: Secondary | ICD-10-CM

## 2017-08-21 DIAGNOSIS — H9201 Otalgia, right ear: Secondary | ICD-10-CM

## 2017-08-21 DIAGNOSIS — J020 Streptococcal pharyngitis: Secondary | ICD-10-CM | POA: Diagnosis not present

## 2017-08-21 MED ORDER — AZITHROMYCIN 250 MG PO TABS: ORAL_TABLET | ORAL | 0 refills | Status: DC

## 2017-08-21 MED ORDER — PREDNISONE 20 MG PO TABS: ORAL_TABLET | ORAL | 0 refills | Status: DC

## 2017-08-21 NOTE — Progress Notes (Signed)
Zarina Pe , [birth year], 37 y.o., female MRN: [id number] Patient Care Team    Relationship Specialty Notifications Start End  Natalia Leatherwood, DO PCP - General Family Medicine  05/15/17   Pa, [facility]    05/15/17     Chief Complaint  Patient presents with  . Ear Fullness    ringing treated for strep and ear infection 10 days ago     Subjective: Pt presents for an OV with complaints of recent [facility] with positive rapid strep results (reviewed in careeverywhere). Pt was prescribed PCN V 500 BID, had only take once a day for a few days anciently She also was diagnosed with otitis externa, and provided with cortisporin ear drops. She feels she is getting a more sinus infection. She felt the ear drops made her ear hurt more. Now her ear is ringing and muffled. She also feels both side of her neck lymph nodes are swollen, not tender.  She has also taken nyquil.   Depression screen [facility] 2/9 06/09/2017 05/15/2017  Decreased Interest 0 1  Down, Depressed, Hopeless 0 2  PHQ - 2 Score 0 3  Altered sleeping 0 0  Tired, decreased energy 1 2  Change in appetite 1 1  Feeling bad or failure about yourself  0 3  Trouble concentrating 0 0  Moving slowly or fidgety/restless 0 0  Suicidal thoughts 0 1  PHQ-9 Score 2 10    No Known Allergies Social History   Tobacco Use  . Smoking status: Former Smoker    Packs/day: 0.25    Years: 6.00    Pack years: 1.50    Types: Cigarettes  . Smokeless tobacco: Never Used  Substance Use Topics  . Alcohol use: No    Comment: socially   Past Medical History:  Diagnosis Date  . Chicken pox   . Frequent urinary tract infections   . GERD (gastroesophageal reflux disease)   . History of kidney stones   . Migraine   . Urinary incontinence    Past Surgical History:  Procedure Laterality Date  . APPENDECTOMY  2009  . CESAREAN SECTION  2001, 2003  . EXTRACORPOREAL SHOCK WAVE LITHOTRIPSY Left  11/28/2016   Procedure: EXTRACORPOREAL SHOCK WAVE LITHOTRIPSY (ESWL);  Surgeon: Crist Fat, MD;  [facility];  Service: Urology;  Laterality: Left;  [phone] [id number]  . EXTRACORPOREAL SHOCK WAVE LITHOTRIPSY Left 01/19/2017   Procedure: LEFT EXTRACORPOREAL SHOCK WAVE LITHOTRIPSY (ESWL);  Surgeon: Malen Gauze, MD;  [facility];  Service: Urology;  Laterality: Left;  . surgery to remove bladder polyps     2002  . URETERAL STENT PLACEMENT Left 2002  . urinary stent placement      secondary to kidney stones   Family History  Problem Relation Age of Onset  . Hyperlipidemia Mother   . Uterine cancer Mother   . Asthma Son   . Diabetes Maternal Grandfather   . Ulcers Paternal Grandmother   . Arthritis Paternal Grandmother   . Depression Paternal Grandmother    Allergies as of 08/21/2017   No Known Allergies     Medication List        Accurate as of 08/21/17 10:12 AM. Always use your most recent med list.          NEOMYCIN-POLYMYXIN-HYDROCORTISONE 1 % Soln OTIC solution Commonly known as:  CORTISPORIN 4 drops in affected ear(s) 4 times daily for 7-10 days.   omeprazole 40  MG capsule Commonly known as:  PRILOSEC Take 1 capsule (40 mg total) by mouth daily.   oxybutynin 5 MG tablet Commonly known as:  DITROPAN Take 5 mg by mouth once.   penicillin v potassium 500 MG tablet Commonly known as:  VEETID Take 500 mg by mouth 2 (two) times daily.   venlafaxine XR 75 MG 24 hr capsule Commonly known as:  EFFEXOR-XR Take 1 capsule (75 mg total) by mouth daily with breakfast.       All past medical history, surgical history, allergies, family history, immunizations andmedications were updated in the EMR today and reviewed under the history and medication portions of their EMR.     ROS: Negative, with the exception of above mentioned in HPI   Objective:  BP 111/79 (BP Location: Left Arm, Patient Position: Sitting, Cuff Size:  Large)   Pulse 80   Temp 98.1 F (36.7 C)   Resp 20   Ht  (1.575 m)   Wt 199 lb (90.3 kg)   SpO2 97%   BMI 36.40 kg/m  Body mass index is 36.4 kg/m. Gen: Afebrile. No acute distress. Nontoxic in appearance, well developed, well nourished.  HENT: AT. [place]. Bilateral TM visualized with fluid level appreciated right TM, no erythema. EAC are normal bilaterally. . MMM, no oral lesions. Bilateral nares without erythema or drainage. Throat without erythema or exudates. No cough or hoarsness Eyes:Pupils Equal Round Reactive to light, Extraocular movements intact,  Conjunctiva without redness, discharge or icterus. Neck/lymp/endocrine: Supple,mild-mod anterior cervical lymphadenopathy CV: RRR Chest: CTAB, no wheeze or crackles. Good air movement, normal resp effort.  Skin: no rashes, purpura or petechiae.  Neuro:  Normal gait. PERLA. EOMi. Alert. Oriented x3   No exam data present No results found. No results found for this or any previous visit (from the past 24 hour(s)).  Assessment/Plan: Darely Becknell is a 37 y.o. female present for OV for  Lymphadenopathy/Right ear pain/Strep throat Rest, hydrate.  Start flonase, consider mucinex (DM if cough) z-pack, prednisone prescribed, take until completed.  If cough present it can last up to 6-8 weeks.  F/U 2 weeks of not improved.     Reviewed expectations re: course of current medical issues.  Discussed self-management of symptoms.  Outlined signs and symptoms indicating need for more acute intervention.  Patient verbalized understanding and all questions were answered.  Patient received an After-Visit Summary.    No orders of the defined types were placed in this encounter.    Note is dictated utilizing voice recognition software. Although note has been proof read prior to signing, occasional typographical errors still can be missed. If any questions arise, please do not hesitate to call for verification.   electronically  signed by:  Felix Pacini, DO  [facility]

## 2017-08-21 NOTE — Telephone Encounter (Signed)
Spoke with patient answered all questions. Patient just wanted to know if she can take her daily dose of prednisone all at one time. She had spoke to her pharmacist and had verified correct dosing.

## 2017-08-21 NOTE — Patient Instructions (Signed)
Rest, hydrate.  Start flonase, consider mucinex (DM if cough) z-pack, prednisone prescribed, take until completed.  F/U 2 weeks of not improved.    You have fluid behind your ear drum. This can take time to absorb.  Take abx as prescribed and prednisone.  Use floanse even after medications are finished.

## 2017-08-21 NOTE — Telephone Encounter (Signed)
Copied from CRM [id number]. Topic: Quick Communication - See Telephone Encounter >> Aug 21, 2017 11:56 AM Raquel Sarna wrote: Pt has question on the predniSONE (DELTASONE) 20 MG tablet.  Pt thought she was to have a lower dosage and also has another thing to discuss about the Rx. Please call pt back to discuss.

## 2017-12-08 ENCOUNTER — Ambulatory Visit: Payer: Self-pay | Admitting: Family Medicine

## 2017-12-08 DIAGNOSIS — Z0289 Encounter for other administrative examinations: Secondary | ICD-10-CM

## 2017-12-11 ENCOUNTER — Ambulatory Visit: Payer: Self-pay | Admitting: Family Medicine

## 2017-12-11 DIAGNOSIS — Z0289 Encounter for other administrative examinations: Secondary | ICD-10-CM

## 2018-01-22 ENCOUNTER — Other Ambulatory Visit: Payer: Self-pay

## 2018-01-22 MED ORDER — OMEPRAZOLE 40 MG PO CPDR: 40.0000 mg | DELAYED_RELEASE_CAPSULE | Freq: Every day | ORAL | 0 refills | Status: DC

## 2018-03-18 IMAGING — CR DG ABDOMEN 1V
1 series · 1 of 1 positions shown · non-contrast
Comparison: Abdominal x-ray dated November 23, 2016. CT abdomen and
pelvis dated November 08, 2016.

CLINICAL DATA: Left ureteral stone.

EXAM:
ABDOMEN - 1 VIEW

[t abdomen supine]
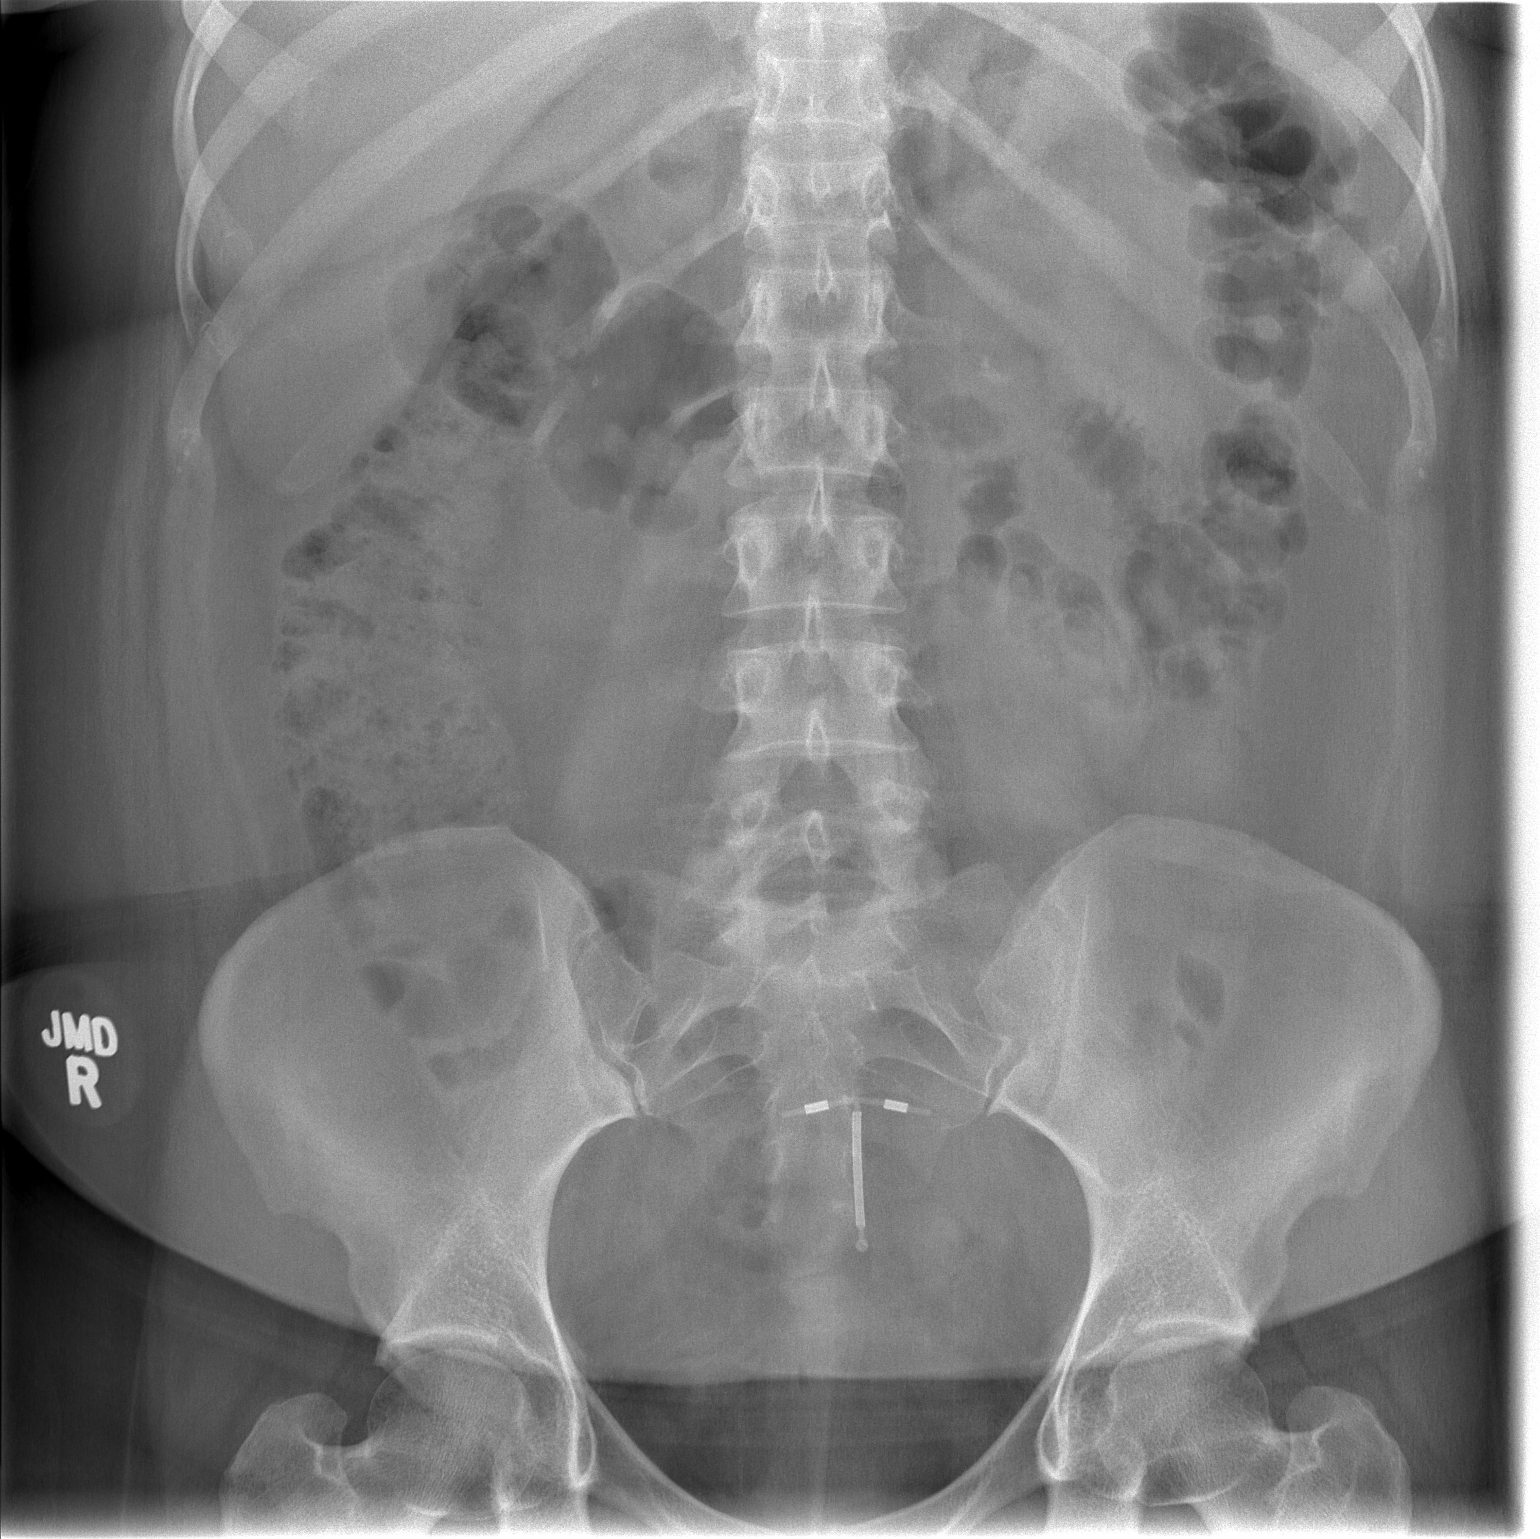

[1 of 1 positions shown; findings below may reference images not displayed]

FINDINGS: There is a 9 mm calcification in the region of the left
ureteropelvic junction, likely corresponding to the calculus seen on
prior CT. This is also unchanged in appearance from the most recent
abdominal x-ray. The bowel gas pattern is normal. IUD in the pelvis.
IMPRESSION: 9 mm calcification in the region of the left ureteropelvic junction,
likely corresponds to the calculus seen on prior CT.

## 2018-03-31 ENCOUNTER — Encounter: Payer: Self-pay | Admitting: Emergency Medicine

## 2018-03-31 ENCOUNTER — Emergency Department (INDEPENDENT_AMBULATORY_CARE_PROVIDER_SITE_OTHER)
Admission: EM | Admit: 2018-03-31 | Discharge: 2018-03-31 | Disposition: A | Discharge status: Home / Self Care | Payer: 59 | Source: Home / Self Care

## 2018-03-31 DIAGNOSIS — H66001 Acute suppurative otitis media without spontaneous rupture of ear drum, right ear: Secondary | ICD-10-CM

## 2018-03-31 MED ORDER — AMOXICILLIN-POT CLAVULANATE 875-125 MG PO TABS: 1.0000 | ORAL_TABLET | Freq: Two times a day (BID) | ORAL | 0 refills | Status: DC

## 2018-03-31 NOTE — Discharge Instructions (Addendum)
You have an acute ear infection on the right side.  This may take 7 to 10 days to completely clear because the fluid is so thick.  The antibiotic prescribed will disinfect the entire ear.  Take it with food so you do not get it upset stomach.  If you are not improving within 7 days, please return so we can recheck the ear.

## 2018-03-31 NOTE — ED Triage Notes (Signed)
Pt c/o R ear pain for several days, states she thinks there is fluid behind her ear.

## 2018-03-31 NOTE — ED Provider Notes (Signed)
[facility]    CSN: [id number] Arrival date & time: 03/31/18  1603     History   Chief Complaint Chief Complaint  Patient presents with  . Otalgia    HPI Alexandra Edwards is a 37 y.o. female.   R ear pain for several days, states she thinks there is fluid behind her ear.   Patient once had similar pain that was treated with steroid successfully.     Past Medical History:  Diagnosis Date  . Chicken pox   . Frequent urinary tract infections   . GERD (gastroesophageal reflux disease)   . History of kidney stones   . Migraine   . Urinary incontinence     Patient Active Problem List   Diagnosis Date Noted  . Depression, major, single episode, mild (HCC) 05/15/2017  . Obesity (BMI 30-39.9) 05/15/2017  . Gastroesophageal reflux disease 05/15/2017  . History of kidney stones 05/15/2017    Past Surgical History:  Procedure Laterality Date  . APPENDECTOMY  2009  . CESAREAN SECTION  2001, 2003  . EXTRACORPOREAL SHOCK WAVE LITHOTRIPSY Left 11/28/2016   Procedure: EXTRACORPOREAL SHOCK WAVE LITHOTRIPSY (ESWL);  Surgeon: Crist FatHerrick, Benjamin W, MD;  Location[facility];  Service: Urology;  Laterality: Left;  [phone];  [phone]  . EXTRACORPOREAL SHOCK WAVE LITHOTRIPSY Left 01/19/2017   Procedure: LEFT EXTRACORPOREAL SHOCK WAVE LITHOTRIPSY (ESWL);  Surgeon: Malen GauzeMcKenzie, Patrick L, MD;  Location[facility];  Service: Urology;  Laterality: Left;  . surgery to remove bladder polyps     2002  . URETERAL STENT PLACEMENT Left 2002  . urinary stent placement      secondary to kidney stones    OB History    Gravida  3   Para  2   Term      Preterm      AB      Living  2     SAB      TAB      Ectopic      Multiple      Live Births               Home Medications    Prior to Admission medications   Medication Sig Start Date End Date Taking? Authorizing Provider  amoxicillin-clavulanate (AUGMENTIN) 875-125 MG tablet Take 1 tablet by  mouth every 12 (twelve) hours. 03/31/18   Elvina SidleLauenstein, Paraskevi Funez, MD    Family History Family History  Problem Relation Age of Onset  . Hyperlipidemia Mother   . Uterine cancer Mother   . Asthma Son   . Diabetes Maternal Grandfather   . Ulcers Paternal Grandmother   . Arthritis Paternal Grandmother   . Depression Paternal Grandmother     Social History Social History   Tobacco Use  . Smoking status: Former Smoker    Packs/day: 0.25    Years: 6.00    Pack years: 1.50    Types: Cigarettes  . Smokeless tobacco: Never Used  Substance Use Topics  . Alcohol use: No    Comment: socially  . Drug use: No     Allergies   Patient has no known allergies.   Review of Systems Review of Systems  HENT: Positive for ear pain.      Physical Exam Triage Vital Signs ED Triage Vitals [03/31/18 1633]  Enc Vitals Group     BP 113/75     Pulse Rate 78     Resp 16     Temp 97.9 F (36.6 C)  Temp src      SpO2 97 %     Weight      Height      Head Circumference      Peak Flow      Pain Score 2     Pain Loc      Pain Edu?      Excl. in GC?    No data found.  Updated Vital Signs BP 113/75   Pulse 78   Temp 97.9 F (36.6 C)   Resp 16   SpO2 97%    Physical Exam Vitals signs and nursing note reviewed.  Constitutional:      Appearance: Normal appearance. She is obese.  HENT:     Head: Normocephalic.     Comments: Right TM has amber meniscus behind, retracted as well    Right Ear: Ear canal and external ear normal.     Left Ear: Tympanic membrane, ear canal and external ear normal.     Nose: Nose normal.     Mouth/Throat:     Mouth: Mucous membranes are moist.     Pharynx: Oropharynx is clear.  Eyes:     Conjunctiva/sclera: Conjunctivae normal.  Neck:     Musculoskeletal: Normal range of motion and neck supple.  Cardiovascular:     Rate and Rhythm: Normal rate.     Heart sounds: Normal heart sounds.  Pulmonary:     Effort: Pulmonary effort is normal.      Breath sounds: Normal breath sounds.  Musculoskeletal: Normal range of motion.  Skin:    General: Skin is warm and dry.  Neurological:     General: No focal deficit present.     Mental Status: She is alert and oriented to person, place, and time.  Psychiatric:        Mood and Affect: Mood normal.        Behavior: Behavior normal.      UC Treatments / Results  Labs (all labs ordered are listed, but only abnormal results are displayed) Labs Reviewed - No data to display  EKG None  Radiology No results found.  Procedures Procedures (including critical care time)  Medications Ordered in UC Medications - No data to display  Initial Impression / Assessment and Plan / UC Course  I have reviewed the triage vital signs and the nursing notes.  Pertinent labs & imaging results that were available during my care of the patient were reviewed by me and considered in my medical decision making (see chart for details).   Final Clinical Impressions(s) / UC Diagnoses   Final diagnoses:  Non-recurrent acute suppurative otitis media of right ear without spontaneous rupture of tympanic membrane     Discharge Instructions     You have an acute ear infection on the right side.  This may take 7 to 10 days to completely clear because the fluid is so thick.  The antibiotic prescribed will disinfect the entire ear.  Take it with food so you do not get it upset stomach.  If you are not improving within 7 days, please return so we can recheck the ear.    ED Prescriptions    Medication Sig Dispense Auth. Provider   amoxicillin-clavulanate (AUGMENTIN) 875-125 MG tablet Take 1 tablet by mouth every 12 (twelve) hours. 14 tablet Elvina SidleLauenstein, Talha Iser, MD     Controlled Substance Prescriptions [place] Controlled Substance Registry consulted? Not Applicable   Elvina SidleLauenstein, Jazzmyne Rasnick, MD 03/31/18 1640

## 2018-04-09 ENCOUNTER — Encounter: Payer: Self-pay | Admitting: Family Medicine

## 2018-04-09 ENCOUNTER — Other Ambulatory Visit: Payer: Self-pay

## 2018-04-09 ENCOUNTER — Ambulatory Visit (INDEPENDENT_AMBULATORY_CARE_PROVIDER_SITE_OTHER): Payer: 59 | Admitting: Family Medicine

## 2018-04-09 VITALS — BP 123/78 | HR 102 | Temp 98.1°F | Resp 16 | Ht 62.0 in | Wt 211.0 lb

## 2018-04-09 DIAGNOSIS — Z79899 Other long term (current) drug therapy: Secondary | ICD-10-CM

## 2018-04-09 DIAGNOSIS — Z23 Encounter for immunization: Secondary | ICD-10-CM | POA: Diagnosis not present

## 2018-04-09 DIAGNOSIS — Z131 Encounter for screening for diabetes mellitus: Secondary | ICD-10-CM | POA: Diagnosis not present

## 2018-04-09 DIAGNOSIS — Z0000 Encounter for general adult medical examination without abnormal findings: Secondary | ICD-10-CM | POA: Diagnosis not present

## 2018-04-09 DIAGNOSIS — Z1239 Encounter for other screening for malignant neoplasm of breast: Secondary | ICD-10-CM | POA: Diagnosis not present

## 2018-04-09 DIAGNOSIS — Z130 Encounter for screening for diseases of the blood and blood-forming organs and certain disorders involving the immune mechanism: Secondary | ICD-10-CM | POA: Diagnosis not present

## 2018-04-09 DIAGNOSIS — K219 Gastro-esophageal reflux disease without esophagitis: Secondary | ICD-10-CM

## 2018-04-09 DIAGNOSIS — E669 Obesity, unspecified: Secondary | ICD-10-CM

## 2018-04-09 DIAGNOSIS — R000 Tachycardia, unspecified: Secondary | ICD-10-CM | POA: Diagnosis not present

## 2018-04-09 LAB — COMPREHENSIVE METABOLIC PANEL
ALBUMIN: 4.3 g/dL (ref 3.5–5.2)
ALK PHOS: 64 U/L (ref 39–117)
ALT: 26 U/L (ref 0–35)
AST: 18 U/L (ref 0–37)
BILIRUBIN TOTAL: 0.5 mg/dL (ref 0.2–1.2)
BUN: 9 mg/dL (ref 6–23)
CALCIUM: 9.7 mg/dL (ref 8.4–10.5)
CO2: 28 mEq/L (ref 19–32)
Chloride: 102 mEq/L (ref 96–112)
Creatinine, Ser: 0.7 mg/dL (ref 0.40–1.20)
GFR: 98.78 mL/min (ref >60.00)
Glucose, Bld: 127 mg/dL — ABNORMAL HIGH (ref 70–99)
Potassium: 4.2 mEq/L (ref 3.5–5.1)
Sodium: 139 mEq/L (ref 135–145)
TOTAL PROTEIN: 6.9 g/dL (ref 6.0–8.3)

## 2018-04-09 LAB — CBC
HCT: 41.7 % (ref 36.0–46.0)
Hemoglobin: 14.2 g/dL (ref 12.0–15.0)
MCHC: 34.1 g/dL (ref 30.0–36.0)
MCV: 86.5 fl (ref 78.0–100.0)
PLATELETS: 310 10*3/uL (ref 150.0–400.0)
RBC: 4.82 Mil/uL (ref 3.87–5.11)
RDW: 13.5 % (ref 11.5–15.5)
WBC: 5.8 10*3/uL (ref 4.0–10.5)

## 2018-04-09 LAB — LIPID PANEL
Cholesterol: 223 mg/dL — ABNORMAL HIGH (ref 0–200)
HDL: 51 mg/dL (ref >39.00)
LDL Cholesterol: 143 mg/dL — ABNORMAL HIGH (ref 0–99)
NonHDL: 171.77
Total CHOL/HDL Ratio: 4
Triglycerides: 145 mg/dL (ref 0.0–149.0)
VLDL: 29 mg/dL (ref 0.0–40.0)

## 2018-04-09 LAB — TSH: TSH: 1.67 u[IU]/mL (ref 0.35–4.50)

## 2018-04-09 LAB — HEMOGLOBIN A1C: HEMOGLOBIN A1C: 5.4 % (ref 4.6–6.5)

## 2018-04-09 MED ORDER — OMEPRAZOLE 40 MG PO CPDR: 40.0000 mg | DELAYED_RELEASE_CAPSULE | Freq: Every day | ORAL | 3 refills | Status: DC

## 2018-04-09 NOTE — Progress Notes (Signed)
Patient ID: Alexandra Edwards, female  DOB: 04/11/1980, 38 y.o.   MRN: [id number] Patient Care Team    Relationship Specialty Notifications Start End  Ma Hillock, DO PCP - General Family Medicine  05/15/17   Pa, [facility]    05/15/17     Chief Complaint  Patient presents with  . Annual Exam    Subjective:  Alexandra Edwards is a 38 y.o.  Female  present for CPE . All past medical history, surgical history, allergies, family history, immunizations, medications and social history were updated in the electronic medical record today. All recent labs, ED visits and hospitalizations within the last year were reviewed.  Health maintenance:  Colonoscopy: no fhx, routine screen Mammogram: no fhx, routine screen at 40--> order placed after 11/03/2018 Cervical cancer screening: last pap: 05/15/2017, results: normal, completed by: Alesia Morin Immunizations: tdap UTD 2012, Influenza completed today (encouraged yearly) Infectious disease screening: HIV completed 2003 DEXA: routine screen Assistive device: none Oxygen YQM:VHQI Patient has a Dental home. Hospitalizations/ED visits: reviewed   Depression screen [facility] 2/9 04/09/2018 06/09/2017 05/15/2017  Decreased Interest 0 0 1  Down, Depressed, Hopeless 0 0 2  PHQ - 2 Score 0 0 3  Altered sleeping 0 0 0  Tired, decreased energy _0 Change in appetite 0 1 1  Feeling bad or failure about yourself  0 0 3  Trouble concentrating 0 0 0  Moving slowly or fidgety/restless 0 0 0  Suicidal thoughts 0 0 1  PHQ-9 Score _1 Difficult doing work/chores Not difficult at all - -   GAD 7 : Generalized Anxiety Score 06/09/2017 05/15/2017  Nervous, Anxious, on Edge 0 2  Control/stop worrying 0 0  Worry too much - different things 0 1  Trouble relaxing 0 0  Restless 0 1  Easily annoyed or irritable 0 2  Afraid - awful might happen 0 1  Total GAD 7 Score 0 7    Immunization History  Administered Date(s) Administered  .  Influenza,inj,Quad PF,6+ Mos 05/15/2017    Past Medical History:  Diagnosis Date  . Chicken pox   . Frequent urinary tract infections   . GERD (gastroesophageal reflux disease)   . History of kidney stones   . Migraine   . Urinary incontinence    No Known Allergies Past Surgical History:  Procedure Laterality Date  . APPENDECTOMY  2009  . CESAREAN SECTION  2001, 2003  . EXTRACORPOREAL SHOCK WAVE LITHOTRIPSY Left 11/28/2016   Procedure: EXTRACORPOREAL SHOCK WAVE LITHOTRIPSY (ESWL);  Surgeon: Ardis Hughs, MD;  Location: [facility];  Service: Urology;  Laterality: Left;  [phone] [id number]  . EXTRACORPOREAL SHOCK WAVE LITHOTRIPSY Left 01/19/2017   Procedure: LEFT EXTRACORPOREAL SHOCK WAVE LITHOTRIPSY (ESWL);  Surgeon: Cleon Gustin, MD;  Location: [facility];  Service: Urology;  Laterality: Left;  . surgery to remove bladder polyps     2002  . URETERAL STENT PLACEMENT Left 2002  . urinary stent placement      secondary to kidney stones   Family History  Problem Relation Age of Onset  . Hyperlipidemia Mother   . Uterine cancer Mother   . Asthma Son   . Diabetes Maternal Grandfather   . Ulcers Paternal Grandmother   . Arthritis Paternal Grandmother   . Depression Paternal Grandmother    Social History   Socioeconomic History  . Marital status: Married    Spouse name: Not on file  . Number of  children: 2  . Years of education: Not on file  . Highest education level: Not on file  Occupational History  . Occupation: Accoiunting  Social Needs  . Financial resource strain: Not on file  . Food insecurity:    Worry: Not on file    Inability: Not on file  . Transportation needs:    Medical: Not on file    Non-medical: Not on file  Tobacco Use  . Smoking status: Former Smoker    Packs/day: 0.25    Years: 6.00    Pack years: 1.50    Types: Cigarettes  . Smokeless tobacco: Never Used  Substance and Sexual Activity  . Alcohol use: No     Comment: socially  . Drug use: No  . Sexual activity: Yes    Partners: Male    Birth control/protection: I.U.D.  Lifestyle  . Physical activity:    Days per week: Not on file    Minutes per session: Not on file  . Stress: Not on file  Relationships  . Social connections:    Talks on phone: Not on file    Gets together: Not on file    Attends religious service: Not on file    Active member of club or organization: Not on file    Attends meetings of clubs or organizations: Not on file    Relationship status: Not on file  . Intimate partner violence:    Fear of current or ex partner: Not on file    Emotionally abused: Not on file    Physically abused: Not on file    Forced sexual activity: Not on file  Other Topics Concern  . Not on file  Social History Narrative   [name] lives w/husband & 2 sons. She relocated from [place].   College-educated. 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   Former smoker.   Drinks caffeine.   Smoke lamina home.   Wears her seatbelt.   Feels safe in her relationships.   Allergies as of 04/09/2018   No Known Allergies     Medication List       Accurate as of April 09, 2018  1:18 PM. Always use your most recent med list.        omeprazole 40 MG capsule Commonly known as:  PRILOSEC Take 40 mg by mouth daily.   oxybutynin 5 MG 24 hr tablet Commonly known as:  DITROPAN-XL Take 5 mg by mouth at bedtime.       All past medical history, surgical history, allergies, family history, immunizations andmedications were updated in the EMR today and reviewed under the history and medication portions of their EMR.     No results found for this or any previous visit (from the past 2160 hour(s)).  No results found.  ROS: 14 pt review of systems performed and negative (unless mentioned in an HPI)  Objective: BP 123/78   Pulse (!) 102   Temp 98.1 F (36.7 C) (Oral)   Resp 16   Ht 5' 2" (1.575 m)   Wt 211 lb (95.7 kg)   SpO2 97%   BMI  38.59 kg/m  Gen: Afebrile. No acute distress. Nontoxic in appearance, well-developed, well-nourished, obese, Caucasian female. HENT: AT. [place]. Bilateral TM visualized and normal in appearance, normal external auditory canal. MMM, no oral lesions, adequate dentition. Bilateral nares within normal limits. Throat without erythema, ulcerations or exudates.  No cough on exam, no hoarseness on exam. Eyes:Pupils Equal Round Reactive to light, Extraocular  movements intact,  Conjunctiva without redness, discharge or icterus. Neck/lymp/endocrine: Supple, no lymphadenopathy, no thyromegaly CV: RRR no murmur, no edema, +2/4 P posterior tibialis pulses.  No carotid bruits. No JVD. Chest: CTAB, no wheeze, rhonchi or crackles.  Normal respiratory effort.  Good air movement. Abd: Soft.  Obese. NTND. BS present.  No masses palpated. No hepatosplenomegaly. No rebound tenderness or guarding. Skin: No rashes, purpura or petechiae. Warm and well-perfused. Skin intact. Neuro/Msk:  Normal gait. PERLA. EOMi. Alert. Oriented x3.  Cranial nerves II through XII intact. Muscle strength 5/5 upper/lower extremity. DTRs equal bilaterally. Psych: Normal affect, dress and demeanor. Normal speech. Normal thought content and judgment.   No exam data present  Assessment/plan: Tayna Smethurst is a 38 y.o. female present for CPE. Obesity (BMI 30-39.9) -Diet and exercise recommended - Lipid panel Need for immunization against influenza - Flu Vaccine QUAD 36+ mos IM Breast cancer screening - MM 3D SCREEN BREAST BILATERAL; Future Screening for deficiency anemia - CBC Screening for diabetes mellitus - HgB A1c Tachycardia - Comp Met (CMET) - TSH Encounter for long-term current use of medication - Comp Met (CMET) Gastroesophageal reflux disease, esophagitis presence not specified Stable.  Continue omeprazole 40 mg every other day, if flare may take daily. Refills provided today. Encounter for preventive health  examination Patient was encouraged to exercise greater than 150 minutes a week. Patient was encouraged to choose a diet filled with fresh fruits and vegetables, and lean meats. AVS provided to patient today for education/recommendation on gender specific health and safety maintenance. Colonoscopy: no fhx, routine screen Mammogram: no fhx, routine screen at 38--> order placed after 11/03/2018 Cervical cancer screening: last pap: 05/15/2017, results: normal, completed by: Alesia Morin Immunizations: tdap UTD 2012, Influenza completed today (encouraged yearly) Infectious disease screening: HIV completed 2003 DEXA: routine screen Return in about 1 year (around 04/10/2019) for CPE.  Electronically signed by: Howard Pouch, DO [place]

## 2018-04-09 NOTE — Patient Instructions (Addendum)
Mammogram ordered today to schedule after 38th bday.   Refilled your meds today.  Great to see you.   Health Maintenance, Female Adopting a healthy lifestyle and getting preventive care can go a long way to promote health and wellness. Talk with your health care provider about what schedule of regular examinations is right for you. This is a good chance for you to check in with your provider about disease prevention and staying healthy. In between checkups, there are plenty of things you can do on your own. Experts have done a lot of research about which lifestyle changes and preventive measures are most likely to keep you healthy. Ask your health care provider for more information. Weight and diet Eat a healthy diet  Be sure to include plenty of vegetables, fruits, low-fat dairy products, and lean protein.  Do not eat a lot of foods high in solid fats, added sugars, or salt.  Get regular exercise. This is one of the most important things you can do for your health. ? Most adults should exercise for at least 150 minutes each week. The exercise should increase your heart rate and make you sweat (moderate-intensity exercise). ? Most adults should also do strengthening exercises at least twice a week. This is in addition to the moderate-intensity exercise. Maintain a healthy weight  Body mass index (BMI) is a measurement that can be used to identify possible weight problems. It estimates body fat based on height and weight. Your health care provider can help determine your BMI and help you achieve or maintain a healthy weight.  For females 81 years of age and older: ? A BMI below 18.5 is considered underweight. ? A BMI of 18.5 to 24.9 is normal. ? A BMI of 25 to 29.9 is considered overweight. ? A BMI of 30 and above is considered obese. Watch levels of cholesterol and blood lipids  You should start having your blood tested for lipids and cholesterol at 40 years of age, then have this test  every 5 years.  You may need to have your cholesterol levels checked more often if: ? Your lipid or cholesterol levels are high. ? You are older than 40 years of age. ? You are at high risk for heart disease. Cancer screening Lung Cancer  Lung cancer screening is recommended for adults 22-39 years old who are at high risk for lung cancer because of a history of smoking.  A yearly low-dose CT scan of the lungs is recommended for people who: ? Currently smoke. ? Have quit within the past 15 years. ? Have at least a 30-pack-year history of smoking. A pack year is smoking an average of one pack of cigarettes a day for 1 year.  Yearly screening should continue until it has been 15 years since you quit.  Yearly screening should stop if you develop a health problem that would prevent you from having lung cancer treatment. Breast Cancer  Practice breast self-awareness. This means understanding how your breasts normally appear and feel.  It also means doing regular breast self-exams. Let your health care provider know about any changes, no matter how small.  If you are in your 20s or 30s, you should have a clinical breast exam (CBE) by a health care provider every 1-3 years as part of a regular health exam.  If you are 35 or older, have a CBE every year. Also consider having a breast X-ray (mammogram) every year.  If you have a family history of breast  cancer, talk to your health care provider about genetic screening.  If you are at high risk for breast cancer, talk to your health care provider about having an MRI and a mammogram every year.  Breast cancer gene (BRCA) assessment is recommended for women who have family members with BRCA-related cancers. BRCA-related cancers include: ? Breast. ? Ovarian. ? Tubal. ? Peritoneal cancers.  Results of the assessment will determine the need for genetic counseling and BRCA1 and BRCA2 testing. Cervical Cancer Your health care provider may  recommend that you be screened regularly for cancer of the pelvic organs (ovaries, uterus, and vagina). This screening involves a pelvic examination, including checking for microscopic changes to the surface of your cervix (Pap test). You may be encouraged to have this screening done every 3 years, beginning at age 80.  For women ages 39-65, health care providers may recommend pelvic exams and Pap testing every 3 years, or they may recommend the Pap and pelvic exam, combined with testing for human papilloma virus (HPV), every 5 years. Some types of HPV increase your risk of cervical cancer. Testing for HPV may also be done on women of any age with unclear Pap test results.  Other health care providers may not recommend any screening for nonpregnant women who are considered low risk for pelvic cancer and who do not have symptoms. Ask your health care provider if a screening pelvic exam is right for you.  If you have had past treatment for cervical cancer or a condition that could lead to cancer, you need Pap tests and screening for cancer for at least 20 years after your treatment. If Pap tests have been discontinued, your risk factors (such as having a new sexual partner) need to be reassessed to determine if screening should resume. Some women have medical problems that increase the chance of getting cervical cancer. In these cases, your health care provider may recommend more frequent screening and Pap tests. Colorectal Cancer  This type of cancer can be detected and often prevented.  Routine colorectal cancer screening usually begins at 40 years of age and continues through 40 years of age.  Your health care provider may recommend screening at an earlier age if you have risk factors for colon cancer.  Your health care provider may also recommend using home test kits to check for hidden blood in the stool.  A small camera at the end of a tube can be used to examine your colon directly  (sigmoidoscopy or colonoscopy). This is done to check for the earliest forms of colorectal cancer.  Routine screening usually begins at age 81.  Direct examination of the colon should be repeated every 5-10 years through 40 years of age. However, you may need to be screened more often if early forms of precancerous polyps or small growths are found. Skin Cancer  Check your skin from head to toe regularly.  Tell your health care provider about any new moles or changes in moles, especially if there is a change in a mole's shape or color.  Also tell your health care provider if you have a mole that is larger than the size of a pencil eraser.  Always use sunscreen. Apply sunscreen liberally and repeatedly throughout the day.  Protect yourself by wearing long sleeves, pants, a wide-brimmed hat, and sunglasses whenever you are outside. Heart disease, diabetes, and high blood pressure  High blood pressure causes heart disease and increases the risk of stroke. High blood pressure is more likely to  develop in: ? People who have blood pressure in the high end of the normal range (130-139/85-89 mm Hg). ? People who are overweight or obese. ? People who are African American.  If you are 41-50 years of age, have your blood pressure checked every 3-5 years. If you are 35 years of age or older, have your blood pressure checked every year. You should have your blood pressure measured twice-once when you are at a hospital or clinic, and once when you are not at a hospital or clinic. Record the average of the two measurements. To check your blood pressure when you are not at a hospital or clinic, you can use: ? An automated blood pressure machine at a pharmacy. ? A home blood pressure monitor.  If you are between 42 years and 42 years old, ask your health care provider if you should take aspirin to prevent strokes.  Have regular diabetes screenings. This involves taking a blood sample to check your  fasting blood sugar level. ? If you are at a normal weight and have a low risk for diabetes, have this test once every three years after 40 years of age. ? If you are overweight and have a high risk for diabetes, consider being tested at a younger age or more often. Preventing infection Hepatitis B  If you have a higher risk for hepatitis B, you should be screened for this virus. You are considered at high risk for hepatitis B if: ? You were born in a country where hepatitis B is common. Ask your health care provider which countries are considered high risk. ? Your parents were born in a high-risk country, and you have not been immunized against hepatitis B (hepatitis B vaccine). ? You have HIV or AIDS. ? You use needles to inject street drugs. ? You live with someone who has hepatitis B. ? You have had sex with someone who has hepatitis B. ? You get hemodialysis treatment. ? You take certain medicines for conditions, including cancer, organ transplantation, and autoimmune conditions. Hepatitis C  Blood testing is recommended for: ? Everyone born from [birth year] through 1967. ? Anyone with known risk factors for hepatitis C. Sexually transmitted infections (STIs)  You should be screened for sexually transmitted infections (STIs) including gonorrhea and chlamydia if: ? You are sexually active and are younger than 40 years of age. ? You are older than 40 years of age and your health care provider tells you that you are at risk for this type of infection. ? Your sexual activity has changed since you were last screened and you are at an increased risk for chlamydia or gonorrhea. Ask your health care provider if you are at risk.  If you do not have HIV, but are at risk, it may be recommended that you take a prescription medicine daily to prevent HIV infection. This is called pre-exposure prophylaxis (PrEP). You are considered at risk if: ? You are sexually active and do not regularly use condoms or  know the HIV status of your partner(s). ? You take drugs by injection. ? You are sexually active with a partner who has HIV. Talk with your health care provider about whether you are at high risk of being infected with HIV. If you choose to begin PrEP, you should first be tested for HIV. You should then be tested every 3 months for as long as you are taking PrEP. Pregnancy  If you are premenopausal and you may become pregnant, ask your health  care provider about preconception counseling.  If you may become pregnant, take 400 to 800 micrograms (mcg) of folic acid every day.  If you want to prevent pregnancy, talk to your health care provider about birth control (contraception). Osteoporosis and menopause  Osteoporosis is a disease in which the bones lose minerals and strength with aging. This can result in serious bone fractures. Your risk for osteoporosis can be identified using a bone density scan.  If you are 24 years of age or older, or if you are at risk for osteoporosis and fractures, ask your health care provider if you should be screened.  Ask your health care provider whether you should take a calcium or vitamin D supplement to lower your risk for osteoporosis.  Menopause may have certain physical symptoms and risks.  Hormone replacement therapy may reduce some of these symptoms and risks. Talk to your health care provider about whether hormone replacement therapy is right for you. Follow these instructions at home:  Schedule regular health, dental, and eye exams.  Stay current with your immunizations.  Do not use any tobacco products including cigarettes, chewing tobacco, or electronic cigarettes.  If you are pregnant, do not drink alcohol.  If you are breastfeeding, limit how much and how often you drink alcohol.  Limit alcohol intake to no more than 1 drink per day for nonpregnant women. One drink equals 12 ounces of beer, 5 ounces of wine, or 1 ounces of hard  liquor.  Do not use street drugs.  Do not share needles.  Ask your health care provider for help if you need support or information about quitting drugs.  Tell your health care provider if you often feel depressed.  Tell your health care provider if you have ever been abused or do not feel safe at home. This information is not intended to replace advice given to you by your health care provider. Make sure you discuss any questions you have with your health care provider. Document Released: 10/04/2010 Document Revised: 08/27/2015 Document Reviewed: 12/23/2014 Elsevier Interactive Patient Education  2019 [employer].

## 2018-04-30 ENCOUNTER — Other Ambulatory Visit: Payer: Self-pay | Admitting: Family Medicine

## 2018-04-30 DIAGNOSIS — Z1231 Encounter for screening mammogram for malignant neoplasm of breast: Secondary | ICD-10-CM

## 2018-09-21 ENCOUNTER — Other Ambulatory Visit: Payer: Self-pay

## 2018-09-21 ENCOUNTER — Ambulatory Visit (INDEPENDENT_AMBULATORY_CARE_PROVIDER_SITE_OTHER): Payer: 59 | Admitting: Family Medicine

## 2018-09-21 ENCOUNTER — Telehealth: Payer: Self-pay | Admitting: *Deleted

## 2018-09-21 ENCOUNTER — Encounter: Payer: Self-pay | Admitting: Family Medicine

## 2018-09-21 VITALS — Temp 98.7°F | Ht 62.0 in

## 2018-09-21 DIAGNOSIS — Z20822 Contact with and (suspected) exposure to covid-19: Secondary | ICD-10-CM

## 2018-09-21 DIAGNOSIS — Z20828 Contact with and (suspected) exposure to other viral communicable diseases: Secondary | ICD-10-CM

## 2018-09-21 DIAGNOSIS — Z7189 Other specified counseling: Secondary | ICD-10-CM

## 2018-09-21 NOTE — Telephone Encounter (Signed)
Notified pt to be scheduled for covid-19. Scheduled at 8:30 on Monday June 22 nd.  Advised that this is a drive thru test site, stay in car with mask on and windows rolled up until time for testing. Verbal understanding.

## 2018-09-21 NOTE — Progress Notes (Signed)
   VIRTUAL VISIT VIA VIDEO  I connected with Alexandra Edwards on 09/21/18 at  4:00 PM EDT by a video enabled telemedicine application and verified that I am speaking with the correct person using two identifiers. Location patient: Home Location provider: [facility], Office Persons participating in the virtual visit: Patient, Dr. Raoul Pitch and R.Baker, LPN  I discussed the limitations of evaluation and management by telemedicine and the availability of in person appointments. The patient expressed understanding and agreed to proceed.   SUBJECTIVE Chief Complaint  Patient presents with  . COVID Testing    Brother was dx today with COVID-19, he was sick x1 week. Pt was with brother 10 days ago. Pt has no fever, SOB, No cough, No GI complaints, no sore throat. Pt does not know if she shoudl have the COVID test     HPI: Alexandra Edwards is a 38 y.o. female present for question on COVID-19 testing.  She was exposed to her brother 10 days ago.  He came down with COVID-19 illness 2 days later.  Denies fever, chills, nausea, vomit, cough or shortness of breath. She is concerned she is asymptomatic but may have virus and does not want to expose anyone else. ROS: See pertinent positives and negatives per HPI.  Patient Active Problem List   Diagnosis Date Noted  . Obesity (BMI 30-39.9) 05/15/2017  . Gastroesophageal reflux disease 05/15/2017  . History of kidney stones 05/15/2017    Social History   Tobacco Use  . Smoking status: Former Smoker    Packs/day: 0.25    Years: 6.00    Pack years: 1.50    Types: Cigarettes  . Smokeless tobacco: Never Used  Substance Use Topics  . Alcohol use: No    Comment: socially    Current Outpatient Medications:  .  omeprazole (PRILOSEC) 40 MG capsule, Take 1 capsule (40 mg total) by mouth daily., Disp: 90 capsule, Rfl: 3 .  oxybutynin (DITROPAN-XL) 5 MG 24 hr tablet, Take 5 mg by mouth at bedtime., Disp: , Rfl:   No Known Allergies  OBJECTIVE:  Temp 98.7 F (37.1 C) (Oral)   Ht 5\' 2"  (1.575 m)   LMP 09/07/2018   BMI 38.59 kg/m  Gen: No acute distress. Nontoxic in appearance.  HENT: AT. [place].  MMM.  Eyes:Pupils Equal Round Reactive to light, Extraocular movements intact,  Conjunctiva without redness, discharge or icterus. CV: no edema Chest: Cough or shortness of breath not present.  Skin: no rashes, purpura or petechiae.  Neuro: Normal gait. Alert. Oriented x3  Psych: Normal affect, dress and demeanor. Normal speech. Normal thought content and judgment.  ASSESSMENT AND PLAN: Alexandra Edwards is a 38 y.o. female present for  Educated About Covid-19 Virus Infection/Close Exposure to Covid-19 Virus - asymptomatic pt, with direct exposure to covid19 case.  -COVID-19 education presented to patient.  She had direct exposure to her brother 10 days ago.  He became sick shortly after and was tested positive for COVID-19. -Encouraged her to quarantine and consider herself positive even though she is asymptomatic until test comes back negative.  She understands instructions today. -[id number] testing arranged at [facility]. - f/u PRN  Howard Pouch, DO 09/21/2018

## 2018-09-21 NOTE — Telephone Encounter (Signed)
-----   Message from Caroll Rancher, LPN sent at 6/83/7290  3:39 PM EDT ----- Regarding: COVID test Alexandra Edwards  DOB [birth year] MRN [id number] [employer] [id number] Reason- Direct COVID exposure   Howard Pouch, DO

## 2018-09-24 ENCOUNTER — Other Ambulatory Visit: Payer: Self-pay

## 2018-09-24 DIAGNOSIS — Z20822 Contact with and (suspected) exposure to covid-19: Secondary | ICD-10-CM

## 2018-09-28 ENCOUNTER — Telehealth: Payer: Self-pay

## 2018-09-28 NOTE — Telephone Encounter (Signed)
Pt Left message on nurses VM asking if COVID test results are back. Pt was called back and told it could take anywhere from 2-10 days to return and it was not back as of now and today is day 5. Pt verbalized understanding

## 2018-09-29 LAB — NOVEL CORONAVIRUS, NAA: SARS-CoV-2, NAA: NOT DETECTED

## 2018-10-01 NOTE — Telephone Encounter (Signed)
Pt was notified of COVID results by [place]

## 2018-10-30 ENCOUNTER — Ambulatory Visit
Admission: RE | Admit: 2018-10-30 | Discharge: 2018-10-30 | Disposition: A | Discharge status: Home / Self Care | Payer: 59 | Source: Ambulatory Visit | Attending: Family Medicine | Admitting: Family Medicine

## 2018-10-30 ENCOUNTER — Other Ambulatory Visit: Payer: Self-pay

## 2018-10-30 DIAGNOSIS — Z1231 Encounter for screening mammogram for malignant neoplasm of breast: Secondary | ICD-10-CM

## 2019-03-06 ENCOUNTER — Other Ambulatory Visit: Payer: Self-pay

## 2019-03-06 DIAGNOSIS — Z20822 Contact with and (suspected) exposure to covid-19: Secondary | ICD-10-CM

## 2019-03-08 LAB — NOVEL CORONAVIRUS, NAA: SARS-CoV-2, NAA: NOT DETECTED

## 2019-06-19 ENCOUNTER — Ambulatory Visit: Payer: 59 | Attending: Internal Medicine

## 2019-06-19 DIAGNOSIS — Z20822 Contact with and (suspected) exposure to covid-19: Secondary | ICD-10-CM

## 2019-06-20 LAB — NOVEL CORONAVIRUS, NAA: SARS-CoV-2, NAA: NOT DETECTED

## 2019-09-16 ENCOUNTER — Other Ambulatory Visit: Payer: Self-pay | Admitting: Family Medicine

## 2019-09-16 DIAGNOSIS — Z1231 Encounter for screening mammogram for malignant neoplasm of breast: Secondary | ICD-10-CM

## 2019-10-31 ENCOUNTER — Ambulatory Visit
Admission: RE | Admit: 2019-10-31 | Discharge: 2019-10-31 | Disposition: A | Discharge status: Home / Self Care | Payer: 59 | Source: Ambulatory Visit | Attending: Family Medicine | Admitting: Family Medicine

## 2019-10-31 ENCOUNTER — Other Ambulatory Visit: Payer: Self-pay

## 2019-10-31 DIAGNOSIS — Z1231 Encounter for screening mammogram for malignant neoplasm of breast: Secondary | ICD-10-CM

## 2019-11-21 ENCOUNTER — Ambulatory Visit (INDEPENDENT_AMBULATORY_CARE_PROVIDER_SITE_OTHER): Payer: No Typology Code available for payment source | Admitting: Family Medicine

## 2019-11-21 ENCOUNTER — Other Ambulatory Visit: Payer: Self-pay

## 2019-11-21 ENCOUNTER — Encounter: Payer: Self-pay | Admitting: Family Medicine

## 2019-11-21 VITALS — BP 100/68 | HR 80 | Temp 98.7°F | Resp 16 | Ht 62.0 in | Wt 207.4 lb

## 2019-11-21 DIAGNOSIS — Z130 Encounter for screening for diseases of the blood and blood-forming organs and certain disorders involving the immune mechanism: Secondary | ICD-10-CM

## 2019-11-21 DIAGNOSIS — Z131 Encounter for screening for diabetes mellitus: Secondary | ICD-10-CM | POA: Diagnosis not present

## 2019-11-21 DIAGNOSIS — K219 Gastro-esophageal reflux disease without esophagitis: Secondary | ICD-10-CM

## 2019-11-21 DIAGNOSIS — E7800 Pure hypercholesterolemia, unspecified: Secondary | ICD-10-CM | POA: Diagnosis not present

## 2019-11-21 DIAGNOSIS — E669 Obesity, unspecified: Secondary | ICD-10-CM | POA: Diagnosis not present

## 2019-11-21 DIAGNOSIS — Z1159 Encounter for screening for other viral diseases: Secondary | ICD-10-CM

## 2019-11-21 DIAGNOSIS — Z30430 Encounter for insertion of intrauterine contraceptive device: Secondary | ICD-10-CM | POA: Insufficient documentation

## 2019-11-21 DIAGNOSIS — Z0000 Encounter for general adult medical examination without abnormal findings: Secondary | ICD-10-CM | POA: Diagnosis not present

## 2019-11-21 DIAGNOSIS — E785 Hyperlipidemia, unspecified: Secondary | ICD-10-CM | POA: Insufficient documentation

## 2019-11-21 LAB — HEMOGLOBIN A1C: Hgb A1c MFr Bld: 5.3 % (ref 4.6–6.5)

## 2019-11-21 MED ORDER — OMEPRAZOLE 40 MG PO CPDR: 40.0000 mg | DELAYED_RELEASE_CAPSULE | Freq: Every day | ORAL | 3 refills | Status: DC

## 2019-11-21 NOTE — Patient Instructions (Addendum)
Schedule Nurse visit for Tdap end of September.    Health Maintenance, Female Adopting a healthy lifestyle and getting preventive care are important in promoting health and wellness. Ask your health care provider about:  The right schedule for you to have regular tests and exams.  Things you can do on your own to prevent diseases and keep yourself healthy. What should I know about diet, weight, and exercise? Eat a healthy diet   Eat a diet that includes plenty of vegetables, fruits, low-fat dairy products, and lean protein.  Do not eat a lot of foods that are high in solid fats, added sugars, or sodium. Maintain a healthy weight Body mass index (BMI) is used to identify weight problems. It estimates body fat based on height and weight. Your health care provider can help determine your BMI and help you achieve or maintain a healthy weight. Get regular exercise Get regular exercise. This is one of the most important things you can do for your health. Most adults should:  Exercise for at least 150 minutes each week. The exercise should increase your heart rate and make you sweat (moderate-intensity exercise).  Do strengthening exercises at least twice a week. This is in addition to the moderate-intensity exercise.  Spend less time sitting. Even light physical activity can be beneficial. Watch cholesterol and blood lipids Have your blood tested for lipids and cholesterol at 41 years of age, then have this test every 5 years. Have your cholesterol levels checked more often if:  Your lipid or cholesterol levels are high.  You are older than 41 years of age.  You are at high risk for heart disease. What should I know about cancer screening? Depending on your health history and family history, you may need to have cancer screening at various ages. This may include screening for:  Breast cancer.  Cervical cancer.  Colorectal cancer.  Skin cancer.  Lung cancer. What should I know  about heart disease, diabetes, and high blood pressure? Blood pressure and heart disease  High blood pressure causes heart disease and increases the risk of stroke. This is more likely to develop in people who have high blood pressure readings, are of African descent, or are overweight.  Have your blood pressure checked: ? Every 3-5 years if you are 41-63 years of age. ? Every year if you are 41 years old or older. Diabetes Have regular diabetes screenings. This checks your fasting blood sugar level. Have the screening done:  Once every three years after age 41 if you are at a normal weight and have a low risk for diabetes.  More often and at a younger age if you are overweight or have a high risk for diabetes. What should I know about preventing infection? Hepatitis B If you have a higher risk for hepatitis B, you should be screened for this virus. Talk with your health care provider to find out if you are at risk for hepatitis B infection. Hepatitis C Testing is recommended for:  Everyone born from [birth year] through 1967.  Anyone with known risk factors for hepatitis C. Sexually transmitted infections (STIs)  Get screened for STIs, including gonorrhea and chlamydia, if: ? You are sexually active and are younger than 41 years of age. ? You are older than 41 years of age and your health care provider tells you that you are at risk for this type of infection. ? Your sexual activity has changed since you were last screened, and you are at increased  risk for chlamydia or gonorrhea. Ask your health care provider if you are at risk.  Ask your health care provider about whether you are at high risk for HIV. Your health care provider may recommend a prescription medicine to help prevent HIV infection. If you choose to take medicine to prevent HIV, you should first get tested for HIV. You should then be tested every 3 months for as long as you are taking the medicine. Pregnancy  If you are about  to stop having your period (premenopausal) and you may become pregnant, seek counseling before you get pregnant.  Take 400 to 800 micrograms (mcg) of folic acid every day if you become pregnant.  Ask for birth control (contraception) if you want to prevent pregnancy. Osteoporosis and menopause Osteoporosis is a disease in which the bones lose minerals and strength with aging. This can result in bone fractures. If you are 41 years old or older, or if you are at risk for osteoporosis and fractures, ask your health care provider if you should:  Be screened for bone loss.  Take a calcium or vitamin D supplement to lower your risk of fractures.  Be given hormone replacement therapy (HRT) to treat symptoms of menopause. Follow these instructions at home: Lifestyle  Do not use any products that contain nicotine or tobacco, such as cigarettes, e-cigarettes, and chewing tobacco. If you need help quitting, ask your health care provider.  Do not use street drugs.  Do not share needles.  Ask your health care provider for help if you need support or information about quitting drugs. Alcohol use  Do not drink alcohol if: ? Your health care provider tells you not to drink. ? You are pregnant, may be pregnant, or are planning to become pregnant.  If you drink alcohol: ? Limit how much you use to 0-1 drink a day. ? Limit intake if you are breastfeeding.  Be aware of how much alcohol is in your drink. In the [place] one drink equals one 12 oz bottle of beer (355 mL), one 5 oz glass of wine (148 mL), or one 1 oz glass of hard liquor (44 mL). General instructions  Schedule regular health, dental, and eye exams.  Stay current with your vaccines.  Tell your health care provider if: ? You often feel depressed. ? You have ever been abused or do not feel safe at home. Summary  Adopting a healthy lifestyle and getting preventive care are important in promoting health and wellness.  Follow your  health care provider's instructions about healthy diet, exercising, and getting tested or screened for diseases.  Follow your health care provider's instructions on monitoring your cholesterol and blood pressure. This information is not intended to replace advice given to you by your health care provider. Make sure you discuss any questions you have with your health care provider. Document Revised: 03/14/2018 Document Reviewed: 03/14/2018 Elsevier Patient Education  2020 [employer].

## 2019-11-21 NOTE — Progress Notes (Signed)
This visit occurred during the SARS-CoV-2 public health emergency.  Safety protocols were in place, including screening questions prior to the visit, additional usage of staff PPE, and extensive cleaning of exam room while observing appropriate contact time as indicated for disinfecting solutions.    Patient ID: Alexandra Edwards, female  DOB: 1980-04-14, 39 y.o.   MRN: [id number] Patient Care Team    Relationship Specialty Notifications Start End  Natalia Leatherwood, DO PCP - General Family Medicine  05/15/17   Pa, [facility]    05/15/17   Venita Sheffield, MD Referring Physician Obstetrics and Gynecology  04/09/18    Comment: [facility] in [place]- uncertain provider    Chief Complaint  Patient presents with  . Annual Exam    pt is fasting    Subjective:  Alexandra Edwards is a 39 y.o.  Female  present for CPE . All past medical history, surgical history, allergies, family history, immunizations, medications and social history were updated in the electronic medical record today. All recent labs, ED visits and hospitalizations within the last year were reviewed.  Health maintenance:  Colonoscopy: no fhx, routine screen 43 Mammogram: no fhx, 10/2019 [facility]. Cervical cancer screening: last pap: 05/15/2017, results: normal, completed by: Alexandra Edwards Immunizations: tdap UTD 2012> due soon can schedule nurse visit next month if desires, Influenza completed today (encouraged yearly), covid completed Infectious disease screening: HIV completed 2003, hep C screening desired DEXA: routine screen Assistive device: none Oxygen [id number] Patient has a Dental home. Hospitalizations/ED visits: none   Depression screen [facility] 2/9 11/21/2019 04/09/2018 06/09/2017 05/15/2017  Decreased Interest 0 0 0 1  Down, Depressed, Hopeless 0 0 0 2  PHQ - 2 Score 0 0 0 3  Altered sleeping - 0 0 0  Tired, decreased energy - 1 1 2   Change in appetite - 0 1 1  Feeling bad or  failure about yourself  - 0 0 3  Trouble concentrating - 0 0 0  Moving slowly or fidgety/restless - 0 0 0  Suicidal thoughts - 0 0 1  PHQ-9 Score - 1 2 10   Difficult doing work/chores - Not difficult at all - -   GAD 7 : Generalized Anxiety Score 06/09/2017 05/15/2017  Nervous, Anxious, on Edge 0 2  Control/stop worrying 0 0  Worry too much - different things 0 1  Trouble relaxing 0 0  Restless 0 1  Easily annoyed or irritable 0 2  Afraid - awful might happen 0 1  Total GAD 7 Score 0 7     Immunization History  Administered Date(s) Administered  . Influenza,inj,Quad PF,6+ Mos 05/15/2017, 04/09/2018  . PFIZER SARS-COV-2 Vaccination 10/24/2019, 11/14/2019    Past Medical History:  Diagnosis Date  . Chicken pox   . Depression    was on effexor; doing well off now  . Frequent urinary tract infections   . GERD (gastroesophageal reflux disease)   . History of kidney stones   . Migraine   . Urinary incontinence    No Known Allergies Past Surgical History:  Procedure Laterality Date  . APPENDECTOMY  2009  . CESAREAN SECTION  2001, 2003  . EXTRACORPOREAL SHOCK WAVE LITHOTRIPSY Left 11/28/2016   Procedure: EXTRACORPOREAL SHOCK WAVE LITHOTRIPSY (ESWL);  Surgeon: 04-07-1972, MD;  Location: [facility];  Service: Urology;  Laterality: Left8/29/2018 [id number]  . EXTRACORPOREAL SHOCK WAVE LITHOTRIPSY Left 01/19/2017   Procedure: LEFT EXTRACORPOREAL SHOCK WAVE LITHOTRIPSY (ESWL);  Surgeon: 02-06-1982  L, MD;  Location: [facility];  Service: Urology;  Laterality: Left;  . surgery to remove bladder polyps     2002  . URETERAL STENT PLACEMENT Left 2002  . urinary stent placement      secondary to kidney stones   Family History  Problem Relation Age of Onset  . Hyperlipidemia Mother   . Uterine cancer Mother   . Asthma Son   . Diabetes Maternal Grandfather   . Ulcers Paternal Grandmother   . Arthritis Paternal Grandmother   . Depression Paternal  Grandmother    Social History   Social History Narrative   Alexandra Edwards lives w/husband & 2 sons. She relocated from [place].   College-educated. 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   Former smoker.   Drinks caffeine.   Smoke lamina home.   Wears her seatbelt.   Feels safe in her relationships.    Allergies as of 11/21/2019   No Known Allergies     Medication List       Accurate as of November 21, 2019  7:31 PM. If you have any questions, ask your nurse or doctor.        omeprazole 40 MG capsule Commonly known as: PRILOSEC Take 1 capsule (40 mg total) by mouth daily.   oxybutynin 10 MG 24 hr tablet Commonly known as: DITROPAN-XL Take 10 mg by mouth daily. What changed: Another medication with the same name was removed. Continue taking this medication, and follow the directions you see here. Changed by: Felix Pacini, DO       All past medical history, surgical history, allergies, family history, immunizations andmedications were updated in the EMR today and reviewed under the history and medication portions of their EMR.     Recent Results (from the past 2160 hour(s))  Hemoglobin A1c     Status: None   Collection Time: 11/21/19  9:48 AM  Result Value Ref Range   Hgb A1c MFr Bld 5.3 4.6 - 6.5 %    Comment: Glycemic Control Guidelines for People with Diabetes:Non Diabetic:  <6%Goal of Therapy: <7%Additional Action Suggested:  >8%     MM 3D SCREEN BREAST BILATERAL  Result Date: 11/04/2019 CLINICAL DATA:  Screening. EXAM: DIGITAL SCREENING BILATERAL MAMMOGRAM WITH TOMO AND CAD COMPARISON:  Previous exam(s). ACR Breast Density Category d: The breast tissue is extremely dense, which lowers the sensitivity of mammography FINDINGS: There are no findings suspicious for malignancy. Images were processed with CAD. IMPRESSION: No mammographic evidence of malignancy. A result letter of this screening mammogram will be mailed directly to the patient. RECOMMENDATION: Screening  mammogram in one year. (Code:SM-B-01Y) BI-RADS CATEGORY  1: Negative. Electronically Signed   By: Edwin Cap M.D.   On: 11/04/2019 12:53    ROS: 14 pt review of systems performed and negative (unless mentioned in an HPI)  Objective: BP 100/68 (BP Location: Left Arm, Patient Position: Sitting, Cuff Size: Large)   Pulse 80   Temp 98.7 F (37.1 C) (Temporal)   Resp 16   Ht 5\' 2"  (1.575 m)   Wt 207 lb 6.4 oz (94.1 kg)   LMP 10/27/2019   SpO2 100%   BMI 37.93 kg/m  Gen: Afebrile. No acute distress. Nontoxic in appearance, well-developed, well-nourished, pleasant, obese female HENT: AT. [place]. Bilateral TM visualized and normal in appearance, normal external auditory canal. MMM, no oral lesions, adequate dentition. Bilateral nares within normal limits. Throat without erythema, ulcerations or exudates.  No cough on exam, no hoarseness on exam.  Eyes:Pupils Equal Round Reactive to light, Extraocular movements intact,  Conjunctiva without redness, discharge or icterus. Neck/lymp/endocrine: Supple, no lymphadenopathy, no thyromegaly CV: RRR no murmur, no edema, +2/4 P posterior tibialis pulses. No JVD. Chest: CTAB, no wheeze, rhonchi or crackles.  Normal respiratory effort.  Good air movement. Abd: Soft.  Flat. NTND. BS present.  No masses palpated. No hepatosplenomegaly. No rebound tenderness or guarding. Skin: No rashes, purpura or petechiae. Warm and well-perfused. Skin intact. Neuro/Msk:  Normal gait. PERLA. EOMi. Alert. Oriented x3.  Cranial nerves II through XII intact. Muscle strength 5/5 upper/lower extremity. DTRs equal bilaterally. Psych: Normal affect, dress and demeanor. Normal speech. Normal thought content and judgment.  No exam data present  Assessment/plan: Jaye Saal is a 39 y.o. female present for CPE  Obesity (BMI 30-39.9) Diet and exercise encouraged  Gastroesophageal reflux disease, unspecified whether esophagitis present Stable continue PPI  Elevated  LDL cholesterol level - Comprehensive metabolic panel - Lipid panel - TSH Screening for deficiency anemia - CBC with Differential/Platelet Diabetes mellitus screening - Hemoglobin A1c Encounter for insertion of copper IUD Patient encouraged to follow-up with her gynecologist.  Copper IUD is at least 41 years old possibly older.  Certainly close to 10 years. Need for hepatitis C screening test - Hepatitis C Antibody Encounter for preventive health examination Patient was encouraged to exercise greater than 150 minutes a week. Patient was encouraged to choose a diet filled with fresh fruits and vegetables, and lean meats. AVS provided to patient today for education/recommendation on gender spColonoscopy: no fhx, routine screen 43 Mammogram: no fhx, 10/2019 [facility]. Cervical cancer screening: last pap: 05/15/2017, results: normal, completed by: Alexandra Edwards Immunizations: tdap UTD 2012> due soon can schedule nurse visit next month if desires, Influenza completed today (encouraged yearly), covid completed Infectious disease screening: HIV completed 2003, hepatitis C screening completed today DEXA: routine screenecific health and safety maintenance.  Return in about 1 year (around 11/20/2020) for CPE (30 min).    Orders Placed This Encounter  Procedures  . Comprehensive metabolic panel  . CBC with Differential/Platelet  . Lipid panel  . TSH  . Hemoglobin A1c  . Hepatitis C Antibody   Meds ordered this encounter  Medications  . omeprazole (PRILOSEC) 40 MG capsule    Sig: Take 1 capsule (40 mg total) by mouth daily.    Dispense:  90 capsule    Refill:  3   Referral Orders  No referral(s) requested today     Electronically signed by: Felix Pacini, DO [facility]

## 2019-11-22 LAB — CBC WITH DIFFERENTIAL/PLATELET
Absolute Monocytes: 371 cells/uL (ref 200–950)
Basophils Absolute: 29 cells/uL (ref 0–200)
Basophils Relative: 0.5 %
Eosinophils Absolute: 191 cells/uL (ref 15–500)
Eosinophils Relative: 3.3 %
HCT: 43.7 % (ref 35.0–45.0)
Hemoglobin: 14.3 g/dL (ref 11.7–15.5)
Lymphs Abs: 1839 cells/uL (ref 850–3900)
MCH: 29.2 pg (ref 27.0–33.0)
MCHC: 32.7 g/dL (ref 32.0–36.0)
MCV: 89.2 fL (ref 80.0–100.0)
MPV: 9.8 fL (ref 7.5–12.5)
Monocytes Relative: 6.4 %
Neutro Abs: 3370 cells/uL (ref 1500–7800)
Neutrophils Relative %: 58.1 %
Platelets: 302 10*3/uL (ref 140–400)
RBC: 4.9 10*6/uL (ref 3.80–5.10)
RDW: 12.8 % (ref 11.0–15.0)
Total Lymphocyte: 31.7 %
WBC: 5.8 10*3/uL (ref 3.8–10.8)

## 2019-11-22 LAB — LIPID PANEL
Cholesterol: 210 mg/dL — ABNORMAL HIGH (ref <200)
HDL: 50 mg/dL (ref >=50)
LDL Cholesterol (Calc): 141 mg/dL (calc) — ABNORMAL HIGH
Non-HDL Cholesterol (Calc): 160 mg/dL (calc) — ABNORMAL HIGH (ref <130)
Total CHOL/HDL Ratio: 4.2 (calc) (ref <5.0)
Triglycerides: 89 mg/dL (ref <150)

## 2019-11-22 LAB — COMPREHENSIVE METABOLIC PANEL
AG Ratio: 1.6 (calc) (ref 1.0–2.5)
ALT: 24 U/L (ref 6–29)
AST: 16 U/L (ref 10–30)
Albumin: 4.3 g/dL (ref 3.6–5.1)
Alkaline phosphatase (APISO): 66 U/L (ref 31–125)
BUN: 12 mg/dL (ref 7–25)
CO2: 22 mmol/L (ref 20–32)
Calcium: 9.3 mg/dL (ref 8.6–10.2)
Chloride: 105 mmol/L (ref 98–110)
Creat: 0.73 mg/dL (ref 0.50–1.10)
Globulin: 2.7 g/dL (calc) (ref 1.9–3.7)
Glucose, Bld: 89 mg/dL (ref 65–99)
Potassium: 4.6 mmol/L (ref 3.5–5.3)
Sodium: 136 mmol/L (ref 135–146)
Total Bilirubin: 0.5 mg/dL (ref 0.2–1.2)
Total Protein: 7 g/dL (ref 6.1–8.1)

## 2019-11-22 LAB — HEPATITIS C ANTIBODY
Hepatitis C Ab: NONREACTIVE
SIGNAL TO CUT-OFF: 0.01 (ref <1.00)

## 2019-11-22 LAB — TSH: TSH: 1.58 mIU/L

## 2019-11-22 NOTE — Progress Notes (Signed)
Notified via My Chart

## 2019-11-22 NOTE — Progress Notes (Signed)
Pt verified understanding results

## 2019-12-31 ENCOUNTER — Other Ambulatory Visit: Payer: Self-pay

## 2019-12-31 ENCOUNTER — Ambulatory Visit (INDEPENDENT_AMBULATORY_CARE_PROVIDER_SITE_OTHER): Payer: No Typology Code available for payment source

## 2019-12-31 DIAGNOSIS — Z23 Encounter for immunization: Secondary | ICD-10-CM | POA: Diagnosis not present

## 2020-02-29 ENCOUNTER — Emergency Department (HOSPITAL_BASED_OUTPATIENT_CLINIC_OR_DEPARTMENT_OTHER)
Admission: EM | Admit: 2020-02-29 | Discharge: 2020-02-29 | Disposition: A | Discharge status: Home / Self Care | Payer: No Typology Code available for payment source | Attending: Emergency Medicine | Admitting: Emergency Medicine

## 2020-02-29 ENCOUNTER — Emergency Department (HOSPITAL_BASED_OUTPATIENT_CLINIC_OR_DEPARTMENT_OTHER): Payer: No Typology Code available for payment source

## 2020-02-29 ENCOUNTER — Encounter (HOSPITAL_BASED_OUTPATIENT_CLINIC_OR_DEPARTMENT_OTHER): Payer: Self-pay | Admitting: Emergency Medicine

## 2020-02-29 ENCOUNTER — Other Ambulatory Visit: Payer: Self-pay

## 2020-02-29 DIAGNOSIS — K219 Gastro-esophageal reflux disease without esophagitis: Secondary | ICD-10-CM | POA: Insufficient documentation

## 2020-02-29 DIAGNOSIS — R109 Unspecified abdominal pain: Secondary | ICD-10-CM | POA: Insufficient documentation

## 2020-02-29 DIAGNOSIS — N200 Calculus of kidney: Secondary | ICD-10-CM

## 2020-02-29 DIAGNOSIS — Z87891 Personal history of nicotine dependence: Secondary | ICD-10-CM | POA: Diagnosis not present

## 2020-02-29 DIAGNOSIS — R110 Nausea: Secondary | ICD-10-CM | POA: Insufficient documentation

## 2020-02-29 DIAGNOSIS — Z87442 Personal history of urinary calculi: Secondary | ICD-10-CM | POA: Diagnosis not present

## 2020-02-29 LAB — COMPREHENSIVE METABOLIC PANEL
ALT: 29 U/L (ref 0–44)
AST: 23 U/L (ref 15–41)
Albumin: 4.1 g/dL (ref 3.5–5.0)
Alkaline Phosphatase: 68 U/L (ref 38–126)
Anion gap: 11 (ref 5–15)
BUN: 11 mg/dL (ref 6–20)
CO2: 22 mmol/L (ref 22–32)
Calcium: 9 mg/dL (ref 8.9–10.3)
Chloride: 103 mmol/L (ref 98–111)
Creatinine, Ser: 0.94 mg/dL (ref 0.44–1.00)
GFR, Estimated: >60 mL/min (ref >60)
Glucose, Bld: 103 mg/dL — ABNORMAL HIGH (ref 70–99)
Potassium: 3.7 mmol/L (ref 3.5–5.1)
Sodium: 136 mmol/L (ref 135–145)
Total Bilirubin: 0.1 mg/dL — ABNORMAL LOW (ref 0.3–1.2)
Total Protein: 7.6 g/dL (ref 6.5–8.1)

## 2020-02-29 LAB — CBC WITH DIFFERENTIAL/PLATELET
Abs Immature Granulocytes: 0.02 10*3/uL (ref 0.00–0.07)
Basophils Absolute: 0 10*3/uL (ref 0.0–0.1)
Basophils Relative: 0 %
Eosinophils Absolute: 0.3 10*3/uL (ref 0.0–0.5)
Eosinophils Relative: 4 %
HCT: 43.6 % (ref 36.0–46.0)
Hemoglobin: 15.1 g/dL — ABNORMAL HIGH (ref 12.0–15.0)
Immature Granulocytes: 0 %
Lymphocytes Relative: 31 %
Lymphs Abs: 2 10*3/uL (ref 0.7–4.0)
MCH: 29.8 pg (ref 26.0–34.0)
MCHC: 34.6 g/dL (ref 30.0–36.0)
MCV: 86.2 fL (ref 80.0–100.0)
Monocytes Absolute: 0.5 10*3/uL (ref 0.1–1.0)
Monocytes Relative: 8 %
Neutro Abs: 3.6 10*3/uL (ref 1.7–7.7)
Neutrophils Relative %: 57 %
Platelets: 329 10*3/uL (ref 150–400)
RBC: 5.06 MIL/uL (ref 3.87–5.11)
RDW: 13 % (ref 11.5–15.5)
WBC: 6.5 10*3/uL (ref 4.0–10.5)
nRBC: 0 % (ref 0.0–0.2)

## 2020-02-29 LAB — URINALYSIS, ROUTINE W REFLEX MICROSCOPIC

## 2020-02-29 LAB — URINALYSIS, MICROSCOPIC (REFLEX): RBC / HPF: >50 RBC/hpf (ref 0–5)

## 2020-02-29 LAB — PREGNANCY, URINE: Preg Test, Ur: NEGATIVE

## 2020-02-29 LAB — LIPASE, BLOOD: Lipase: 38 U/L (ref 11–51)

## 2020-02-29 MED ORDER — HYDROMORPHONE HCL 1 MG/ML IJ SOLN
1.0000 mg | Freq: Once | INTRAMUSCULAR | Status: AC
  Administered 2020-02-29: 1 mg via INTRAVENOUS
  Filled 2020-02-29: qty 1

## 2020-02-29 MED ORDER — KETOROLAC TROMETHAMINE 30 MG/ML IJ SOLN
30.0000 mg | Freq: Once | INTRAMUSCULAR | Status: AC
  Administered 2020-02-29: 30 mg via INTRAVENOUS
  Filled 2020-02-29: qty 1

## 2020-02-29 MED ORDER — LACTATED RINGERS IV BOLUS
1000.0000 mL | Freq: Once | INTRAVENOUS | Status: AC
  Administered 2020-02-29: 1000 mL via INTRAVENOUS

## 2020-02-29 MED ORDER — ONDANSETRON HCL 4 MG/2ML IJ SOLN
4.0000 mg | Freq: Once | INTRAMUSCULAR | Status: AC
  Administered 2020-02-29: 4 mg via INTRAVENOUS
  Filled 2020-02-29: qty 2

## 2020-02-29 MED ORDER — MORPHINE SULFATE (PF) 4 MG/ML IV SOLN
4.0000 mg | Freq: Once | INTRAVENOUS | Status: AC
  Administered 2020-02-29: 4 mg via INTRAVENOUS
  Filled 2020-02-29: qty 1

## 2020-02-29 MED ORDER — ONDANSETRON 4 MG PO TBDP
4.0000 mg | ORAL_TABLET | Freq: Three times a day (TID) | ORAL | 0 refills | Status: DC | PRN
Start: 2020-02-29 — End: 2020-11-24

## 2020-02-29 MED ORDER — OXYCODONE-ACETAMINOPHEN 5-325 MG PO TABS: 1.0000 | ORAL_TABLET | Freq: Four times a day (QID) | ORAL | 0 refills | Status: DC | PRN

## 2020-02-29 NOTE — ED Provider Notes (Signed)
[facility] EMERGENCY DEPARTMENT Provider Note   CSN: [id number] Arrival date & time: 02/29/20  7858   History Chief Complaint  Patient presents with  . Flank Pain    Alexandra Edwards is a 39 y.o. female.  The history is provided by the patient.  Flank Pain  She has history of kidney stones and comes in complaining of severe right flank pain which woke her up from sleep at about 4 AM.  Pain does radiate to the right lower abdomen.  There has been associated nausea but no vomiting.  She did urinate and noted there was some blood present.  She has not taken anything for pain.  She rates pain at 9/10.  She denies fever, chills, sweats.  Past Medical History:  Diagnosis Date  . Chicken pox   . Depression    was on effexor; doing well off now  . Frequent urinary tract infections   . GERD (gastroesophageal reflux disease)   . History of kidney stones   . Migraine   . Urinary incontinence     Patient Active Problem List   Diagnosis Date Noted  . Elevated LDL cholesterol level 11/21/2019  . copper IUD in place 11/21/2019  . Obesity (BMI 30-39.9) 05/15/2017  . Gastroesophageal reflux disease 05/15/2017  . History of kidney stones 05/15/2017    Past Surgical History:  Procedure Laterality Date  . APPENDECTOMY  2009  . CESAREAN SECTION  2001, 2003  . EXTRACORPOREAL SHOCK WAVE LITHOTRIPSY Left 11/28/2016   Procedure: EXTRACORPOREAL SHOCK WAVE LITHOTRIPSY (ESWL);  Surgeon: Crist Fat, MD;  Location: [facility];  Service: Urology;  Laterality: Left;  [phone] [id number]  . EXTRACORPOREAL SHOCK WAVE LITHOTRIPSY Left 01/19/2017   Procedure: LEFT EXTRACORPOREAL SHOCK WAVE LITHOTRIPSY (ESWL);  Surgeon: Malen Gauze, MD;  Location: [facility];  Service: Urology;  Laterality: Left;  . surgery to remove bladder polyps     2002  . URETERAL STENT PLACEMENT Left 2002  . urinary stent placement      secondary to kidney stones     OB  History    Gravida  3   Para  2   Term      Preterm      AB      Living  2     SAB      TAB      Ectopic      Multiple      Live Births              Family History  Problem Relation Age of Onset  . Hyperlipidemia Mother   . Uterine cancer Mother   . Asthma Son   . Diabetes Maternal Grandfather   . Ulcers Paternal Grandmother   . Arthritis Paternal Grandmother   . Depression Paternal Grandmother     Social History   Tobacco Use  . Smoking status: Former Smoker    Packs/day: 0.25    Years: 6.00    Pack years: 1.50    Types: Cigarettes  . Smokeless tobacco: Never Used  Vaping Use  . Vaping Use: Never used  Substance Use Topics  . Alcohol use: No    Comment: socially  . Drug use: No    Home Medications Prior to Admission medications   Medication Sig Start Date End Date Taking? Authorizing Provider  omeprazole (PRILOSEC) 40 MG capsule Take 1 capsule (40 mg total) by mouth daily. 11/21/19   Kuneff, Renee A, DO  oxybutynin (DITROPAN-XL) 10  MG 24 hr tablet Take 10 mg by mouth daily. 11/12/19   [provider]    Allergies    Patient has no known allergies.  Review of Systems   Review of Systems  Genitourinary: Positive for flank pain.  All other systems reviewed and are negative.   Physical Exam Updated Vital Signs BP 123/82 (BP Location: Right Arm)   Pulse 85   Temp (!) 97.4 F (36.3 C) (Oral)   Resp 20   Ht 5\' 2"  (1.575 m)   Wt 90.7 kg   LMP 02/17/2020 (Exact Date)   SpO2 98%   BMI 36.58 kg/m   Physical Exam Vitals and nursing note reviewed.   39 year old female, in obvious pain, but in no acute distress. Vital signs are normal. Oxygen saturation is 98%, which is normal. Head is normocephalic and atraumatic. PERRLA, EOMI. Oropharynx is clear. Neck is nontender and supple without adenopathy or JVD. Back is nontender in the midline.  There is moderate right CVA tenderness. Lungs are clear without rales, wheezes, or  rhonchi. Chest is nontender. Heart has regular rate and rhythm without murmur. Abdomen is soft, flat, nontender without masses or hepatosplenomegaly and peristalsis is hypoactive. Extremities have no cyanosis or edema, full range of motion is present. Skin is warm and dry without rash. Neurologic: Mental status is normal, cranial nerves are intact, there are no motor or sensory deficits.  ED Results / Procedures / Treatments   Labs (all labs ordered are listed, but only abnormal results are displayed) Labs Reviewed  COMPREHENSIVE METABOLIC PANEL - Abnormal; Notable for the following components:      Result Value   Glucose, Bld 103 (*)    Total Bilirubin 0.1 (*)    All other components within normal limits  CBC WITH DIFFERENTIAL/PLATELET - Abnormal; Notable for the following components:   Hemoglobin 15.1 (*)    All other components within normal limits  LIPASE, BLOOD  URINALYSIS, ROUTINE W REFLEX MICROSCOPIC  PREGNANCY, URINE    Radiology No results found.  Procedures Procedures   Medications Ordered in ED Medications - No data to display  ED Course  I have reviewed the triage vital signs and the nursing notes.  Pertinent labs & imaging results that were available during my care of the patient were reviewed by me and considered in my medical decision making (see chart for details).  MDM Rules/Calculators/A&P Right flank pain suspicious for ureteral colic.  Doubt cholecystitis, pancreatitis, appendicitis.  Doubt pyelonephritis.  Old records are reviewed confirming prior history of kidney stones.  She will be sent for renal stone protocol CT scan and be given IV fluids, morphine, ondansetron.  Labs are unremarkable, urinalysis and CT scan are pending. Case is signed out to Dr. 46.  Final Clinical Impression(s) / ED Diagnoses Final diagnoses:  Right flank pain    Rx / DC Orders ED Discharge Orders    None       Particia Nearing, MD 02/29/20 [id number]

## 2020-02-29 NOTE — ED Provider Notes (Signed)
Pt signed out by Dr. Preston Fleeting pending CT Renal.    IMPRESSION:  1. Multiple small right renal calculi. There is right-sided  hydronephrosis secondary to 3 x 2 mm proximal right ureteral  calculus.  2. 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  3. Left kidney parapelvic cyst.  4. Bilateral ovary cysts. No follow-up imaging recommended. Note:  This recommendation does not apply to premenarchal patients and to  those with increased risk (genetic, family history, elevated tumor  markers or other high-risk factors) of ovarian cancer. Reference:  JACR 2020 Feb; 17(2):248-254     Pain is improving, but she still has pain.  She will be given toradol and dilaudid and will be d/c home.  She is encouraged to f/u with urology.  She is d/c with percocet and zofran.  Return if worse.     Alexandra Lefevre, MD 02/29/20 [phone]

## 2020-02-29 NOTE — ED Triage Notes (Signed)
Pt is c/o right flank pain that started this morning around 4 am  Pt has hx of kidney stones  Pt has nausea with vomiting associated with the pain

## 2020-09-16 ENCOUNTER — Other Ambulatory Visit: Payer: Self-pay | Admitting: Family Medicine

## 2020-09-16 DIAGNOSIS — Z1231 Encounter for screening mammogram for malignant neoplasm of breast: Secondary | ICD-10-CM

## 2020-11-10 ENCOUNTER — Ambulatory Visit
Admission: RE | Admit: 2020-11-10 | Discharge: 2020-11-10 | Disposition: A | Discharge status: Home / Self Care | Payer: No Typology Code available for payment source | Source: Ambulatory Visit | Attending: Family Medicine | Admitting: Family Medicine

## 2020-11-10 ENCOUNTER — Other Ambulatory Visit: Payer: Self-pay

## 2020-11-10 DIAGNOSIS — Z1231 Encounter for screening mammogram for malignant neoplasm of breast: Secondary | ICD-10-CM

## 2020-11-20 ENCOUNTER — Encounter: Payer: No Typology Code available for payment source | Admitting: Family Medicine

## 2020-11-24 ENCOUNTER — Encounter: Payer: No Typology Code available for payment source | Admitting: Family Medicine

## 2020-11-24 ENCOUNTER — Encounter: Payer: Self-pay | Admitting: Family Medicine

## 2020-11-24 ENCOUNTER — Ambulatory Visit (INDEPENDENT_AMBULATORY_CARE_PROVIDER_SITE_OTHER): Payer: No Typology Code available for payment source | Admitting: Family Medicine

## 2020-11-24 ENCOUNTER — Other Ambulatory Visit: Payer: Self-pay

## 2020-11-24 VITALS — BP 94/62 | HR 61 | Temp 98.0°F | Ht 62.0 in | Wt 211.0 lb

## 2020-11-24 DIAGNOSIS — Z23 Encounter for immunization: Secondary | ICD-10-CM | POA: Diagnosis not present

## 2020-11-24 DIAGNOSIS — Z1231 Encounter for screening mammogram for malignant neoplasm of breast: Secondary | ICD-10-CM

## 2020-11-24 DIAGNOSIS — Z0000 Encounter for general adult medical examination without abnormal findings: Secondary | ICD-10-CM

## 2020-11-24 DIAGNOSIS — R3129 Other microscopic hematuria: Secondary | ICD-10-CM

## 2020-11-24 DIAGNOSIS — E7800 Pure hypercholesterolemia, unspecified: Secondary | ICD-10-CM

## 2020-11-24 DIAGNOSIS — Z30430 Encounter for insertion of intrauterine contraceptive device: Secondary | ICD-10-CM | POA: Diagnosis not present

## 2020-11-24 DIAGNOSIS — E669 Obesity, unspecified: Secondary | ICD-10-CM | POA: Diagnosis not present

## 2020-11-24 DIAGNOSIS — Z131 Encounter for screening for diabetes mellitus: Secondary | ICD-10-CM | POA: Diagnosis not present

## 2020-11-24 LAB — URINALYSIS, ROUTINE W REFLEX MICROSCOPIC
Bilirubin Urine: NEGATIVE
Hgb urine dipstick: NEGATIVE
Ketones, ur: NEGATIVE
Nitrite: NEGATIVE
Specific Gravity, Urine: 1.025 (ref 1.000–1.030)
Total Protein, Urine: NEGATIVE
Urine Glucose: NEGATIVE
Urobilinogen, UA: 0.2 (ref 0.0–1.0)
pH: 6 (ref 5.0–8.0)

## 2020-11-24 LAB — CBC WITH DIFFERENTIAL/PLATELET
Basophils Absolute: 0 10*3/uL (ref 0.0–0.1)
Basophils Relative: 0.4 % (ref 0.0–3.0)
Eosinophils Absolute: 0.1 10*3/uL (ref 0.0–0.7)
Eosinophils Relative: 2 % (ref 0.0–5.0)
HCT: 43.2 % (ref 36.0–46.0)
Hemoglobin: 14.4 g/dL (ref 12.0–15.0)
Lymphocytes Relative: 31.6 % (ref 12.0–46.0)
Lymphs Abs: 2 10*3/uL (ref 0.7–4.0)
MCHC: 33.2 g/dL (ref 30.0–36.0)
MCV: 88.5 fl (ref 78.0–100.0)
Monocytes Absolute: 0.4 10*3/uL (ref 0.1–1.0)
Monocytes Relative: 6.5 % (ref 3.0–12.0)
Neutro Abs: 3.7 10*3/uL (ref 1.4–7.7)
Neutrophils Relative %: 59.5 % (ref 43.0–77.0)
Platelets: 294 10*3/uL (ref 150.0–400.0)
RBC: 4.88 Mil/uL (ref 3.87–5.11)
RDW: 13.9 % (ref 11.5–15.5)
WBC: 6.3 10*3/uL (ref 4.0–10.5)

## 2020-11-24 LAB — LIPID PANEL
Cholesterol: 243 mg/dL — ABNORMAL HIGH (ref 0–200)
HDL: 56.5 mg/dL (ref >39.00)
LDL Cholesterol: 157 mg/dL — ABNORMAL HIGH (ref 0–99)
NonHDL: 186.52
Total CHOL/HDL Ratio: 4
Triglycerides: 150 mg/dL — ABNORMAL HIGH (ref 0.0–149.0)
VLDL: 30 mg/dL (ref 0.0–40.0)

## 2020-11-24 LAB — COMPREHENSIVE METABOLIC PANEL
ALT: 22 U/L (ref 0–35)
AST: 15 U/L (ref 0–37)
Albumin: 4.3 g/dL (ref 3.5–5.2)
Alkaline Phosphatase: 68 U/L (ref 39–117)
BUN: 12 mg/dL (ref 6–23)
CO2: 24 mEq/L (ref 19–32)
Calcium: 9.5 mg/dL (ref 8.4–10.5)
Chloride: 101 mEq/L (ref 96–112)
Creatinine, Ser: 0.71 mg/dL (ref 0.40–1.20)
GFR: 105.12 mL/min (ref >60.00)
Glucose, Bld: 68 mg/dL — ABNORMAL LOW (ref 70–99)
Potassium: 4 mEq/L (ref 3.5–5.1)
Sodium: 136 mEq/L (ref 135–145)
Total Bilirubin: 0.5 mg/dL (ref 0.2–1.2)
Total Protein: 7.3 g/dL (ref 6.0–8.3)

## 2020-11-24 LAB — TSH: TSH: 1.49 u[IU]/mL (ref 0.35–5.50)

## 2020-11-24 LAB — HEMOGLOBIN A1C: Hgb A1c MFr Bld: 5.4 % (ref 4.6–6.5)

## 2020-11-24 NOTE — Patient Instructions (Signed)
Health Maintenance, Female Adopting a healthy lifestyle and getting preventive care are important in promoting health and wellness. Ask your health care provider about: The right schedule for you to have regular tests and exams. Things you can do on your own to prevent diseases and keep yourself healthy. What should I know about diet, weight, and exercise? Eat a healthy diet  Eat a diet that includes plenty of vegetables, fruits, low-fat dairy products, and lean protein. Do not eat a lot of foods that are high in solid fats, added sugars, or sodium.  Maintain a healthy weight Body mass index (BMI) is used to identify weight problems. It estimates body fat based on height and weight. Your health care provider can help determineyour BMI and help you achieve or maintain a healthy weight. Get regular exercise Get regular exercise. This is one of the most important things you can do for your health. Most adults should: Exercise for at least 150 minutes each week. The exercise should increase your heart rate and make you sweat (moderate-intensity exercise). Do strengthening exercises at least twice a week. This is in addition to the moderate-intensity exercise. Spend less time sitting. Even light physical activity can be beneficial. Watch cholesterol and blood lipids Have your blood tested for lipids and cholesterol at 42 years of age, then havethis test every 5 years. Have your cholesterol levels checked more often if: Your lipid or cholesterol levels are high. You are older than 42 years of age. You are at high risk for heart disease. What should I know about cancer screening? Depending on your health history and family history, you may need to have cancer screening at various ages. This may include screening for: Breast cancer. Cervical cancer. Colorectal cancer. Skin cancer. Lung cancer. What should I know about heart disease, diabetes, and high blood pressure? Blood pressure and heart  disease High blood pressure causes heart disease and increases the risk of stroke. This is more likely to develop in people who have high blood pressure readings, are of African descent, or are overweight. Have your blood pressure checked: Every 3-5 years if you are 18-39 years of age. Every year if you are 40 years old or older. Diabetes Have regular diabetes screenings. This checks your fasting blood sugar level. Have the screening done: Once every three years after age 40 if you are at a normal weight and have a low risk for diabetes. More often and at a younger age if you are overweight or have a high risk for diabetes. What should I know about preventing infection? Hepatitis B If you have a higher risk for hepatitis B, you should be screened for this virus. Talk with your health care provider to find out if you are at risk forhepatitis B infection. Hepatitis C Testing is recommended for: Everyone born from 1945 through 1965. Anyone with known risk factors for hepatitis C. Sexually transmitted infections (STIs) Get screened for STIs, including gonorrhea and chlamydia, if: You are sexually active and are younger than 42 years of age. You are older than 42 years of age and your health care provider tells you that you are at risk for this type of infection. Your sexual activity has changed since you were last screened, and you are at increased risk for chlamydia or gonorrhea. Ask your health care provider if you are at risk. Ask your health care provider about whether you are at high risk for HIV. Your health care provider may recommend a prescription medicine to help   prevent HIV infection. If you choose to take medicine to prevent HIV, you should first get tested for HIV. You should then be tested every 3 months for as long as you are taking the medicine. Pregnancy If you are about to stop having your period (premenopausal) and you may become pregnant, seek counseling before you get  pregnant. Take 400 to 800 micrograms (mcg) of folic acid every day if you become pregnant. Ask for birth control (contraception) if you want to prevent pregnancy. Osteoporosis and menopause Osteoporosis is a disease in which the bones lose minerals and strength with aging. This can result in bone fractures. If you are 65 years old or older, or if you are at risk for osteoporosis and fractures, ask your health care provider if you should: Be screened for bone loss. Take a calcium or vitamin D supplement to lower your risk of fractures. Be given hormone replacement therapy (HRT) to treat symptoms of menopause. Follow these instructions at home: Lifestyle Do not use any products that contain nicotine or tobacco, such as cigarettes, e-cigarettes, and chewing tobacco. If you need help quitting, ask your health care provider. Do not use street drugs. Do not share needles. Ask your health care provider for help if you need support or information about quitting drugs. Alcohol use Do not drink alcohol if: Your health care provider tells you not to drink. You are pregnant, may be pregnant, or are planning to become pregnant. If you drink alcohol: Limit how much you use to 0-1 drink a day. Limit intake if you are breastfeeding. Be aware of how much alcohol is in your drink. In the [place] one drink equals one 12 oz bottle of beer (355 mL), one 5 oz glass of wine (148 mL), or one 1 oz glass of hard liquor (44 mL). General instructions Schedule regular health, dental, and eye exams. Stay current with your vaccines. Tell your health care provider if: You often feel depressed. You have ever been abused or do not feel safe at home. Summary Adopting a healthy lifestyle and getting preventive care are important in promoting health and wellness. Follow your health care provider's instructions about healthy diet, exercising, and getting tested or screened for diseases. Follow your health care provider's  instructions on monitoring your cholesterol and blood pressure. This information is not intended to replace advice given to you by your health care provider. Make sure you discuss any questions you have with your healthcare provider. Document Revised: 03/14/2018 Document Reviewed: 03/14/2018 Elsevier Patient Education  2022 Elsevier Inc.  

## 2020-11-24 NOTE — Progress Notes (Signed)
This visit occurred during the SARS-CoV-2 public health emergency.  Safety protocols were in place, including screening questions prior to the visit, additional usage of staff PPE, and extensive cleaning of exam room while observing appropriate contact time as indicated for disinfecting solutions.    Patient ID: Alexandra Edwards, female  DOB: Mar 04, 1981, 40 y.o.   MRN: [id number] Patient Care Team    Relationship Specialty Notifications Start End  Natalia Leatherwood, DO PCP - General Family Medicine  05/15/17   Pa, [facility]    05/15/17   Venita Sheffield, MD Referring Physician Obstetrics and Gynecology  04/09/18    Comment: [facility] in [place]- uncertain provider    Chief Complaint  Patient presents with   Annual Exam    Pt is fasting    Subjective: Alexandra Edwards is a 40 y.o.  Female  present for CPE. All past medical history, surgical history, allergies, family history, immunizations, medications and social history were updated in the electronic medical record today. All recent labs, ED visits and hospitalizations within the last year were reviewed.  Health maintenance:  Colonoscopy: no fhx, routine screen 43 Mammogram: no fhx, 11/2020 [facility]. Cervical cancer screening: last pap: 05/15/2017, results: normal, completed by: Alvie Heidelberg Immunizations: tdap UTD 2021, Influenza completed today (encouraged yearly), covid completed Infectious disease screening: HIV completed 2003, hep C screening completed DEXA: routine screen Assistive device: none Oxygen YQM:VHQI Patient has a Dental home. Hospitalizations/ED visits: none  Depression screen [facility] 2/9 11/24/2020 11/21/2019 04/09/2018 06/09/2017 05/15/2017  Decreased Interest 0 0 0 0 1  Down, Depressed, Hopeless 0 0 0 0 2  PHQ - 2 Score 0 0 0 0 3  Altered sleeping - - 0 0 0  Tired, decreased energy - - 1 1 2   Change in appetite - - 0 1 1  Feeling bad or failure about yourself  - - 0 0 3  Trouble  concentrating - - 0 0 0  Moving slowly or fidgety/restless - - 0 0 0  Suicidal thoughts - - 0 0 1  PHQ-9 Score - - 1 2 10   Difficult doing work/chores - - Not difficult at all - -   GAD 7 : Generalized Anxiety Score 06/09/2017 05/15/2017  Nervous, Anxious, on Edge 0 2  Control/stop worrying 0 0  Worry too much - different things 0 1  Trouble relaxing 0 0  Restless 0 1  Easily annoyed or irritable 0 2  Afraid - awful might happen 0 1  Total GAD 7 Score 0 7   Immunization History  Administered Date(s) Administered   Influenza,inj,Quad PF,6+ Mos 05/15/2017, 04/09/2018, 12/31/2019   PFIZER(Purple Top)SARS-COV-2 Vaccination 10/24/2019, 11/14/2019   Tdap 12/31/2019   Past Medical History:  Diagnosis Date   Chicken pox    Depression    was on effexor; doing well off now   Frequent urinary tract infections    GERD (gastroesophageal reflux disease)    History of kidney stones    Migraine    Urinary incontinence    No Known Allergies Past Surgical History:  Procedure Laterality Date   APPENDECTOMY  2009   CESAREAN SECTION  2001, 2003   EXTRACORPOREAL SHOCK WAVE LITHOTRIPSY Left 11/28/2016   Procedure: EXTRACORPOREAL SHOCK WAVE LITHOTRIPSY (ESWL);  Surgeon: 2004, MD;  Location: [facility];  Service: Urology;  Laterality: Left8/29/2018 [id number]   EXTRACORPOREAL SHOCK WAVE LITHOTRIPSY Left 01/19/2017   Procedure: LEFT EXTRACORPOREAL SHOCK WAVE LITHOTRIPSY (ESWL);  Surgeon:  McKenzie, Mardene Celeste, MD;  Location: [facility];  Service: Urology;  Laterality: Left;   surgery to remove bladder polyps     2002   URETERAL STENT PLACEMENT Left 2002   urinary stent placement      secondary to kidney stones   Family History  Problem Relation Age of Onset   Hyperlipidemia Mother    Uterine cancer Mother    Diabetes Maternal Grandfather    Ulcers Paternal Grandmother    Arthritis Paternal Grandmother    Depression Paternal Grandmother    Asthma Son    Breast  cancer Neg Hx    Social History   Social History Narrative   [name] lives w/husband & 2 sons. She relocated from [place].   College-educated. 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   Former smoker.   Drinks caffeine.   Smoke lamina home.   Wears her seatbelt.   Feels safe in her relationships.    Allergies as of 11/24/2020   No Known Allergies      Medication List        Accurate as of November 24, 2020  1:31 PM. If you have any questions, ask your nurse or doctor.          STOP taking these medications    omeprazole 40 MG capsule Commonly known as: PRILOSEC Stopped by: Felix Pacini, DO   ondansetron 4 MG disintegrating tablet Commonly known as: Zofran ODT Stopped by: Felix Pacini, DO   oxyCODONE-acetaminophen 5-325 MG tablet Commonly known as: PERCOCET/ROXICET Stopped by: Felix Pacini, DO       TAKE these medications    oxybutynin 10 MG 24 hr tablet Commonly known as: DITROPAN-XL Take 10 mg by mouth daily.        All past medical history, surgical history, allergies, family history, immunizations andmedications were updated in the EMR today and reviewed under the history and medication portions of their EMR.     No results found for this or any previous visit (from the past 2160 hour(s)).  MM 3D SCREEN BREAST BILATERAL Result Date: 11/12/2020  IMPRESSION: No mammographic evidence of malignancy. A result letter of this screening mammogram will be mailed directly to the patient. RECOMMENDATION: Screening mammogram in one year. (Code:[id number]-B-01Y) BI-RADS CATEGORY  1: Negative. Electronically Signed   By: Amie Portland M.D.   On: 11/12/2020 15:21   ROS: 14 pt review of systems performed and negative (unless mentioned in an HPI)  Objective: BP 94/62   Pulse 61   Temp 98 F (36.7 C) (Oral)   Ht 5\' 2"  (1.575 m)   Wt 211 lb (95.7 kg)   SpO2 99%   BMI 38.59 kg/m  Gen: Afebrile. No acute distress. Nontoxic in appearance, well-developed,  well-nourished,  pleasant obese female.  HENT: AT. [place]. Bilateral TM visualized and normal in appearance, normal external auditory canal. MMM, no oral lesions, adequate dentition. Bilateral nares within normal limits. Throat without erythema, ulcerations or exudates. no Cough on exam, no hoarseness on exam. Eyes:Pupils Equal Round Reactive to light, Extraocular movements intact,  Conjunctiva without redness, discharge or icterus. Neck/lymp/endocrine: Supple,no lymphadenopathy, no thyromegaly CV: RRR no murmur, no edema, +2/4 P posterior tibialis pulses.  Chest: CTAB, no wheeze, rhonchi or crackles. normal Respiratory effort. good Air movement. Abd: Soft. obese. NTND. BS present. no Masses palpated. No hepatosplenomegaly. No rebound tenderness or guarding. Skin: no rashes, purpura or petechiae. Warm and well-perfused. Skin intact. Neuro/Msk:  Normal gait. PERLA. EOMi. Alert. Oriented x3.  Cranial  nerves II through XII intact. Muscle strength 5/5 upper/lower extremity. DTRs equal bilaterally. Psych: Normal affect, dress and demeanor. Normal speech. Normal thought content and judgment.   No results found.  Assessment/plan: Cedar Roseman is a 40 y.o. female present for CPE Obesity (BMI 30-39.9)Elevated LDL cholesterol level Routine exercise  - Comprehensive metabolic panel - Lipid panel - TSH copper IUD in place - CBC with Differential/Platelet Diabetes mellitus screening - Hemoglobin A1c Influenza vaccination administered at current visit - Flu Vaccine QUAD 6+ mos PF IM (Fluarix Quad PF) Breast cancer screening by mammogram - MM 3D SCREEN BREAST BILATERAL; Future Other microscopic hematuria - Urinalysis, Routine w reflex microscopic  Routine general medical examination at a health care facility Colonoscopy: no fhx, routine screen 43 Mammogram: no fhx, 11/2020 [facility]. Cervical cancer screening: last pap: 05/15/2017, results: normal, completed by: Alvie Heidelberg Immunizations: tdap UTD 2021, Influenza completed today (encouraged yearly), covid completed Infectious disease screening: HIV completed 2003, hep C screening completed DEXA: routine screen Patient was encouraged to exercise greater than 150 minutes a week. Patient was encouraged to choose a diet filled with fresh fruits and vegetables, and lean meats. AVS provided to patient today for education/recommendation on gender specific health and safety maintenance.  Return in about 1 year (around 11/25/2021) for CPE (30 min).    Orders Placed This Encounter  Procedures   MM 3D SCREEN BREAST BILATERAL   Flu Vaccine QUAD 6+ mos PF IM (Fluarix Quad PF)   CBC with Differential/Platelet   Comprehensive metabolic panel   Hemoglobin A1c   Lipid panel   TSH   Urinalysis, Routine w reflex microscopic   No orders of the defined types were placed in this encounter.  Referral Orders  No referral(s) requested today     Electronically signed by: Felix Pacini, DO [facility]

## 2020-11-25 ENCOUNTER — Telehealth: Payer: Self-pay | Admitting: Family Medicine

## 2020-11-25 ENCOUNTER — Encounter: Payer: Self-pay | Admitting: Family Medicine

## 2020-11-25 DIAGNOSIS — Z79899 Other long term (current) drug therapy: Secondary | ICD-10-CM | POA: Insufficient documentation

## 2020-11-25 MED ORDER — ATORVASTATIN CALCIUM 10 MG PO TABS: 10.0000 mg | ORAL_TABLET | Freq: Every evening | ORAL | 3 refills | Status: DC

## 2020-11-25 NOTE — Telephone Encounter (Signed)
Pt agreed to starting meds

## 2020-11-25 NOTE — Telephone Encounter (Signed)
Spoke with pt regarding labs and instructions. Pt will need to come back to complete urine.

## 2020-11-25 NOTE — Addendum Note (Signed)
Addended by: Felix Pacini A on: 11/25/2020 04:55 PM   Modules accepted: Orders

## 2020-11-25 NOTE — Telephone Encounter (Signed)
Atorvastatin 10 mg nightly prescribed

## 2020-11-25 NOTE — Telephone Encounter (Signed)
Please see if we can either send the specimen for urine culture or have patient come in to perform a urinalysis to be sent for culture to ensure there is no urinary tract infection. - Thanks.    Please call patient Alexandra Edwards, Alexandra and thyroid function are normal Edwards cell counts and electrolytes are normal Diabetes screening/A1c is normal  Urine did not contain Edwards, Alexandra did contain many leukocytes.  She was not having symptoms of a Edwards, Alexandra was completed for hematuria.      Cholesterol panel is elevated with an elevation in her LDL/bad cholesterol to 157.  Alexandra is higher than last year-once the LDL levels near the 160 mark, a cholesterol medication is indicated to help lower cholesterol and provide cardiovascular protection.  -I do recommend she work on increasing her exercise routine to at least 150 minutes a week of cardiovascular exercise.  Also dietary recommendations are lower saturated fat diet, higher fiber diet with fresh fruits and vegetables, lean meats.  Avoid butters, fatty meats and unhealthy oils-all of oil and canola oil are the healthier options of oil. - If she would like to start a cholesterol-lowering medication now, I will call Alexandra in for her -1 tab before bed of Lipitor low-dose. F/u 3 mos after start.  - If she does not desire to start medications at Alexandra time for assistance Her cholesterol and cardiovascular protection, then I would encourage her to work on the exercise and dietary recommendations listed above and follow-up in 6 months for cholesterol recheck.

## 2020-11-26 ENCOUNTER — Other Ambulatory Visit: Payer: Self-pay

## 2020-11-26 ENCOUNTER — Ambulatory Visit: Payer: No Typology Code available for payment source

## 2020-11-26 DIAGNOSIS — R8290 Unspecified abnormal findings in urine: Secondary | ICD-10-CM

## 2020-11-27 LAB — URINE CULTURE
MICRO NUMBER:: 12292505
Result:: NO GROWTH
SPECIMEN QUALITY:: ADEQUATE

## 2020-12-22 ENCOUNTER — Encounter: Payer: No Typology Code available for payment source | Admitting: Family Medicine

## 2021-01-18 ENCOUNTER — Other Ambulatory Visit: Payer: Self-pay

## 2021-01-18 DIAGNOSIS — R079 Chest pain, unspecified: Secondary | ICD-10-CM | POA: Diagnosis present

## 2021-01-18 DIAGNOSIS — R0789 Other chest pain: Secondary | ICD-10-CM | POA: Insufficient documentation

## 2021-01-18 DIAGNOSIS — Z87891 Personal history of nicotine dependence: Secondary | ICD-10-CM | POA: Diagnosis not present

## 2021-01-18 DIAGNOSIS — R109 Unspecified abdominal pain: Secondary | ICD-10-CM | POA: Insufficient documentation

## 2021-01-19 ENCOUNTER — Encounter (HOSPITAL_BASED_OUTPATIENT_CLINIC_OR_DEPARTMENT_OTHER): Payer: Self-pay

## 2021-01-19 ENCOUNTER — Other Ambulatory Visit: Payer: Self-pay

## 2021-01-19 ENCOUNTER — Emergency Department (HOSPITAL_BASED_OUTPATIENT_CLINIC_OR_DEPARTMENT_OTHER): Payer: No Typology Code available for payment source | Admitting: Radiology

## 2021-01-19 ENCOUNTER — Emergency Department (HOSPITAL_BASED_OUTPATIENT_CLINIC_OR_DEPARTMENT_OTHER): Payer: No Typology Code available for payment source

## 2021-01-19 ENCOUNTER — Emergency Department (HOSPITAL_BASED_OUTPATIENT_CLINIC_OR_DEPARTMENT_OTHER)
Admission: EM | Admit: 2021-01-19 | Discharge: 2021-01-19 | Disposition: A | Discharge status: Home / Self Care | Payer: No Typology Code available for payment source | Attending: Emergency Medicine | Admitting: Emergency Medicine

## 2021-01-19 DIAGNOSIS — R079 Chest pain, unspecified: Secondary | ICD-10-CM

## 2021-01-19 DIAGNOSIS — R0789 Other chest pain: Secondary | ICD-10-CM | POA: Diagnosis not present

## 2021-01-19 LAB — BASIC METABOLIC PANEL
Anion gap: 10 (ref 5–15)
BUN: 12 mg/dL (ref 6–20)
CO2: 26 mmol/L (ref 22–32)
Calcium: 10.3 mg/dL (ref 8.9–10.3)
Chloride: 102 mmol/L (ref 98–111)
Creatinine, Ser: 0.64 mg/dL (ref 0.44–1.00)
GFR, Estimated: >60 mL/min (ref >60)
Glucose, Bld: 90 mg/dL (ref 70–99)
Potassium: 4 mmol/L (ref 3.5–5.1)
Sodium: 138 mmol/L (ref 135–145)

## 2021-01-19 LAB — TROPONIN I (HIGH SENSITIVITY)
Troponin I (High Sensitivity): <2 ng/L (ref <18)
Troponin I (High Sensitivity): <2 ng/L (ref <18)

## 2021-01-19 LAB — CBC
HCT: 42.6 % (ref 36.0–46.0)
Hemoglobin: 14.3 g/dL (ref 12.0–15.0)
MCH: 29.4 pg (ref 26.0–34.0)
MCHC: 33.6 g/dL (ref 30.0–36.0)
MCV: 87.7 fL (ref 80.0–100.0)
Platelets: 288 10*3/uL (ref 150–400)
RBC: 4.86 MIL/uL (ref 3.87–5.11)
RDW: 13 % (ref 11.5–15.5)
WBC: 5.4 10*3/uL (ref 4.0–10.5)
nRBC: 0 % (ref 0.0–0.2)

## 2021-01-19 MED ORDER — IOHEXOL 350 MG/ML SOLN
100.0000 mL | Freq: Once | INTRAVENOUS | Status: AC | PRN
  Administered 2021-01-19: 100 mL via INTRAVENOUS

## 2021-01-19 MED ORDER — OMEPRAZOLE 20 MG PO CPDR
20.0000 mg | DELAYED_RELEASE_CAPSULE | Freq: Every day | ORAL | 1 refills | Status: DC
Start: 2021-01-19 — End: 2021-11-25

## 2021-01-19 NOTE — ED Provider Notes (Signed)
[facility] Emergency Department Provider Note MRN:  [id number]  Arrival date & time: 01/19/21     Chief Complaint   Chest Pain   History of Present Illness   Alexandra Edwards is a 40 y.o. year-old female with a history of GERD presenting to the ED with chief complaint of chest pain.  Location: Central chest with radiation to the back and right neck Duration: Several hours Onset: Sudden Timing: Constant Description: Sharp Severity: Severe but improving with time Exacerbating/Alleviating Factors: None Associated Symptoms: None Pertinent Negatives: No dizziness or sweatiness, no nausea vomiting, no shortness of breath, no leg pain or swelling  Additional History: Feels different than acid pain  Review of Systems  A complete 10 system review of systems was obtained and all systems are negative except as noted in the HPI and PMH.   Patient's Health History    Past Medical History:  Diagnosis Date   Chicken pox    Depression    was on effexor; doing well off now   Frequent urinary tract infections    GERD (gastroesophageal reflux disease)    History of kidney stones    Migraine    Urinary incontinence     Past Surgical History:  Procedure Laterality Date   APPENDECTOMY  2009   CESAREAN SECTION  2001, 2003   EXTRACORPOREAL SHOCK WAVE LITHOTRIPSY Left 11/28/2016   Procedure: EXTRACORPOREAL SHOCK WAVE LITHOTRIPSY (ESWL);  Surgeon: Crist Fat, MD;  [facility];  Service: Urology;  Laterality: Left;  [phone] [id number]   EXTRACORPOREAL SHOCK WAVE LITHOTRIPSY Left 01/19/2017   Procedure: LEFT EXTRACORPOREAL SHOCK WAVE LITHOTRIPSY (ESWL);  Surgeon: Malen Gauze, MD;  [facility];  Service: Urology;  Laterality: Left;   surgery to remove bladder polyps     2002   URETERAL STENT PLACEMENT Left 2002   urinary stent placement      secondary to kidney stones    Family History  Problem Relation Age  of Onset   Hyperlipidemia Mother    Uterine cancer Mother    Diabetes Maternal Grandfather    Ulcers Paternal Grandmother    Arthritis Paternal Grandmother    Depression Paternal Grandmother    Asthma Son    Breast cancer Neg Hx     Social History   Socioeconomic History   Marital status: Married    Spouse name: Not on file   Number of children: 2   Years of education: Not on file   Highest education level: Not on file  Occupational History   Occupation: [employer]  Tobacco Use   Smoking status: Former    Packs/day: 0.25    Years: 6.00    Pack years: 1.50    Types: Cigarettes    Passive exposure: Never   Smokeless tobacco: Never  Vaping Use   Vaping Use: Never used  Substance and Sexual Activity   Alcohol use: No    Comment: socially   Drug use: No   Sexual activity: Yes    Partners: Male    Birth control/protection: I.U.D.  Other Topics Concern   Not on file  Social History Narrative   Alexandra Edwards lives w/husband & 2 sons. She relocated from [place].   College-educated. 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   Former smoker.   Drinks caffeine.   Smoke lamina home.   Wears her seatbelt.   Feels safe in her relationships.   Social Determinants of Health   Financial Resource Strain: Not on  file  Food Insecurity: Not on file  Transportation Needs: Not on file  Physical Activity: Not on file  Stress: Not on file  Social Connections: Not on file  Intimate Partner Violence: Not on file     Physical Exam   Vitals:   01/19/21 0245 01/19/21 0400  BP: 124/88 116/64  Pulse: 81 77  Resp: 15 18  Temp:    SpO2: 100% 100%    CONSTITUTIONAL: Well-appearing, NAD NEURO:  Alert and oriented x 3, no focal deficits EYES:  eyes equal and reactive ENT/NECK:  no LAD, no JVD CARDIO: Regular rate, well-perfused, normal S1 and S2 PULM:  CTAB no wheezing or rhonchi GI/GU:  normal bowel sounds, non-distended, non-tender MSK/SPINE:  No gross deformities, no  edema SKIN:  no rash, atraumatic PSYCH:  Appropriate speech and behavior  *Additional and/or pertinent findings included in MDM below  Diagnostic and Interventional Summary    EKG Interpretation  Date/Time:  Tuesday January 19 2021 00:11:22 EDT Ventricular Rate:  82 PR Interval:  154 QRS Duration: 80 QT Interval:  388 QTC Calculation: 453 R Axis:   68 Text Interpretation: Normal sinus rhythm with sinus arrhythmia Low voltage QRS Borderline ECG Confirmed by Kennis Carina [phone]) on 01/19/2021 2:58:35 AM       Labs Reviewed  BASIC METABOLIC PANEL  CBC  PREGNANCY, URINE  TROPONIN I (HIGH SENSITIVITY)  TROPONIN I (HIGH SENSITIVITY)    CT Angio Chest Aorta W and/or Wo Contrast  Final Result    CT ABDOMEN PELVIS WO CONTRAST  Final Result    DG Chest 2 View  Final Result      Medications  iohexol (OMNIPAQUE) 350 MG/ML injection 100 mL (100 mLs Intravenous Contrast Given 01/19/21 0340)     Procedures  /  Critical Care Procedures  ED Course and Medical Decision Making  I have reviewed the triage vital signs, the nursing notes, and pertinent available records from the EMR.  Listed above are laboratory and imaging tests that I personally ordered, reviewed, and interpreted and then considered in my medical decision making (see below for details).  Favoring GERD related pain but difficult to exclude dissection given the description and radiation.  We will pursue CTA.  We will also obtain repeat troponin to exclude signs of ACS or NSTEMI.     CTA is reassuring and troponin is negative x2, patient is an appropriate for discharge.  Elmer Sow. Pilar Plate, MD [facility] Emergency Medicine [facility] [email or web address]  Final Clinical Impressions(s) / ED Diagnoses     ICD-10-CM   1. Chest pain, unspecified type  R07.9       ED Discharge Orders          Ordered    omeprazole (PRILOSEC) 20 MG capsule  Daily        01/19/21 0439              Discharge Instructions Discussed with and Provided to Patient:     Discharge Instructions      You were evaluated in the Emergency Department and after careful evaluation, we did not find any emergent condition requiring admission or further testing in the hospital.  Your exam/testing today is overall reassuring.  Recommend starting the omeprazole medication to help with any pain related to acid reflux, recommend follow-up with your primary care doctor.  Please return to the Emergency Department if you experience any worsening of your condition.   Thank you for allowing [place] to be a part of your  care.        Sabas Sous, MD 01/19/21 [phone]

## 2021-01-19 NOTE — Discharge Instructions (Addendum)
You were evaluated in the Emergency Department and after careful evaluation, we did not find any emergent condition requiring admission or further testing in the hospital.  Your exam/testing today is overall reassuring.  Recommend starting the omeprazole medication to help with any pain related to acid reflux, recommend follow-up with your primary care doctor.  Please return to the Emergency Department if you experience any worsening of your condition.   Thank you for allowing [place] to be a part of your care.

## 2021-01-19 NOTE — ED Triage Notes (Signed)
Patient here POV from Home with CP.  Patient states for several Weeks she had been experiencing Heartburn. Patient states today she began to have CP. Pain is Mid-Chest and radiates to Mid Back.   NAD Noted during Triage. A&Ox4. GCS 15. Ambulatory. No N/V/D. Mild SOB at Times.

## 2021-03-01 ENCOUNTER — Encounter: Payer: Self-pay | Admitting: Family Medicine

## 2021-03-01 ENCOUNTER — Ambulatory Visit (INDEPENDENT_AMBULATORY_CARE_PROVIDER_SITE_OTHER): Payer: No Typology Code available for payment source | Admitting: Family Medicine

## 2021-03-01 ENCOUNTER — Other Ambulatory Visit: Payer: Self-pay

## 2021-03-01 VITALS — BP 103/67 | HR 66 | Temp 98.1°F | Ht 62.0 in | Wt 214.0 lb

## 2021-03-01 DIAGNOSIS — Z79899 Other long term (current) drug therapy: Secondary | ICD-10-CM

## 2021-03-01 DIAGNOSIS — E785 Hyperlipidemia, unspecified: Secondary | ICD-10-CM

## 2021-03-01 DIAGNOSIS — E669 Obesity, unspecified: Secondary | ICD-10-CM

## 2021-03-01 LAB — COMPREHENSIVE METABOLIC PANEL
ALT: 25 U/L (ref 0–35)
AST: 16 U/L (ref 0–37)
Albumin: 4.3 g/dL (ref 3.5–5.2)
Alkaline Phosphatase: 69 U/L (ref 39–117)
BUN: 16 mg/dL (ref 6–23)
CO2: 24 mEq/L (ref 19–32)
Calcium: 9.4 mg/dL (ref 8.4–10.5)
Chloride: 103 mEq/L (ref 96–112)
Creatinine, Ser: 0.77 mg/dL (ref 0.40–1.20)
GFR: 95.19 mL/min (ref >60.00)
Glucose, Bld: 93 mg/dL (ref 70–99)
Potassium: 3.9 mEq/L (ref 3.5–5.1)
Sodium: 138 mEq/L (ref 135–145)
Total Bilirubin: 0.4 mg/dL (ref 0.2–1.2)
Total Protein: 7 g/dL (ref 6.0–8.3)

## 2021-03-01 LAB — LIPID PANEL
Cholesterol: 199 mg/dL (ref 0–200)
HDL: 60.6 mg/dL (ref >39.00)
LDL Cholesterol: 104 mg/dL — ABNORMAL HIGH (ref 0–99)
NonHDL: 138.4
Total CHOL/HDL Ratio: 3
Triglycerides: 170 mg/dL — ABNORMAL HIGH (ref 0.0–149.0)
VLDL: 34 mg/dL (ref 0.0–40.0)

## 2021-03-01 MED ORDER — ATORVASTATIN CALCIUM 10 MG PO TABS: 10.0000 mg | ORAL_TABLET | Freq: Every evening | ORAL | 3 refills | Status: DC

## 2021-03-01 NOTE — Progress Notes (Signed)
This visit occurred during the SARS-CoV-2 public health emergency.  Safety protocols were in place, including screening questions prior to the visit, additional usage of staff PPE, and extensive cleaning of exam room while observing appropriate contact time as indicated for disinfecting solutions.    Alexandra Edwards , 09/22/1980, 40 y.o., female MRN: [id number] Patient Care Team    Relationship Specialty Notifications Start End  Natalia Leatherwood, DO PCP - General Family Medicine  05/15/17   Pa, [facility]    05/15/17   Venita Sheffield, MD Referring Physician Obstetrics and Gynecology  04/09/18    Comment: [facility] in [place]- uncertain provider    Chief Complaint  Patient presents with   Hyperlipidemia    Pt is fasting     Subjective: Pt presents for an OV HLD follow up Hld/obesity/statin start- Pt was seein in the summer for her cpe and found to have elevated cholesterol levels. She was started on liptor 10 mg qhs, which she is tolerating. She has been trying to cut back on cholesterol in her diet.    Depression screen [facility] 2/9 03/01/2021 11/24/2020 11/21/2019 04/09/2018 06/09/2017  Decreased Interest 0 0 0 0 0  Down, Depressed, Hopeless 0 0 0 0 0  PHQ - 2 Score 0 0 0 0 0  Altered sleeping - - - 0 0  Tired, decreased energy - - - 1 1  Change in appetite - - - 0 1  Feeling bad or failure about yourself  - - - 0 0  Trouble concentrating - - - 0 0  Moving slowly or fidgety/restless - - - 0 0  Suicidal thoughts - - - 0 0  PHQ-9 Score - - - 1 2  Difficult doing work/chores - - - Not difficult at all -    No Known Allergies Social History   Social History Narrative   Alexandra Edwards lives w/husband & 2 sons. She relocated from [place].   College-educated. 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   Former smoker.   Drinks caffeine.   Smoke lamina home.   Wears her seatbelt.   Feels safe in her relationships.   Past Medical History:  Diagnosis Date    Chicken pox    Depression    was on effexor; doing well off now   Frequent urinary tract infections    GERD (gastroesophageal reflux disease)    History of kidney stones    Migraine    Urinary incontinence    Past Surgical History:  Procedure Laterality Date   APPENDECTOMY  2009   CESAREAN SECTION  2001, 2003   EXTRACORPOREAL SHOCK WAVE LITHOTRIPSY Left 11/28/2016   Procedure: EXTRACORPOREAL SHOCK WAVE LITHOTRIPSY (ESWL);  Surgeon: Crist Fat, MD;  Location: [facility];  Service: Urology;  Laterality: Left;  [phone] [id number]   EXTRACORPOREAL SHOCK WAVE LITHOTRIPSY Left 01/19/2017   Procedure: LEFT EXTRACORPOREAL SHOCK WAVE LITHOTRIPSY (ESWL);  Surgeon: Malen Gauze, MD;  Location: [facility];  Service: Urology;  Laterality: Left;   surgery to remove bladder polyps     2002   URETERAL STENT PLACEMENT Left 2002   urinary stent placement      secondary to kidney stones   Family History  Problem Relation Age of Onset   Hyperlipidemia Mother    Uterine cancer Mother    Diabetes Maternal Grandfather    Ulcers Paternal Grandmother    Arthritis Paternal Grandmother    Depression Paternal Grandmother  Asthma Son    Breast cancer Neg Hx    Allergies as of 03/01/2021   No Known Allergies      Medication List        Accurate as of March 01, 2021  8:51 AM. If you have any questions, ask your nurse or doctor.          atorvastatin 10 MG tablet Commonly known as: LIPITOR Take 1 tablet (10 mg total) by mouth at bedtime.   omeprazole 20 MG capsule Commonly known as: PRILOSEC Take 1 capsule (20 mg total) by mouth daily.   oxybutynin 10 MG 24 hr tablet Commonly known as: DITROPAN-XL Take 10 mg by mouth daily.        All past medical history, surgical history, allergies, family history, immunizations andmedications were updated in the EMR today and reviewed under the history and medication portions of their EMR.     ROS:  Negative, with the exception of above mentioned in HPI   Objective:  BP 103/67   Pulse 66   Temp 98.1 F (36.7 C) (Oral)   Ht 5\' 2"  (1.575 m)   Wt 214 lb (97.1 kg)   SpO2 97%   BMI 39.14 kg/m  Body mass index is 39.14 kg/m. Gen: Afebrile. No acute distress. Nontoxic in appearance, well developed, well nourished.  HENT: AT. [place].  Eyes:Pupils Equal Round Reactive to light, Extraocular movements intact,  Conjunctiva without redness, discharge or icterus. CV: RRR no murmur, no edema Chest: CTAB, no wheeze or crackles. Good air movement, normal resp effort.  Abd: Soft. NTND. BS present Skin: no rashes, purpura or petechiae.  Neuro: Normal gait. PERLA. EOMi. Alert. Oriented x3  Psych: Normal affect, dress and demeanor. Normal speech. Normal thought content and judgment.  No results found. No results found. No results found for this or any previous visit (from the past 24 hour(s)).  Assessment/Plan: Alexandra Edwards is a 40 y.o. female present for OV for  Hyperlipidemia LDL goal <130/statin therapy/obesity Tolerating statin.  Has been trying to change diet.  Continue Lipitor qhs - Comprehensive metabolic panel - Lipid panel F/u at CPE yearly Reviewed expectations re: course of current medical issues. Discussed self-management of symptoms. Outlined signs and symptoms indicating need for more acute intervention. Patient verbalized understanding and all questions were answered. Patient received an After-Visit Summary.    Orders Placed This Encounter  Procedures   Comprehensive metabolic panel   Lipid panel    Meds ordered this encounter  Medications   atorvastatin (LIPITOR) 10 MG tablet    Sig: Take 1 tablet (10 mg total) by mouth at bedtime.    Dispense:  90 tablet    Refill:  3    Referral Orders  No referral(s) requested today     Note is dictated utilizing voice recognition software. Although note has been proof read prior to signing, occasional  typographical errors still can be missed. If any questions arise, please do not hesitate to call for verification.   electronically signed by:  45, DO  [facility] [place]

## 2021-03-01 NOTE — Patient Instructions (Addendum)
Great to see you today.  I have refilled the medication(s) we provide.   We will inform you of lab results once received either by echart message or telephone call.   - echart message- for normal results that have been seen by the patient already.   - telephone call: abnormal results or if patient has not viewed results in their echart.   Preventing High Cholesterol Cholesterol is a white, waxy substance similar to fat that the human body needs to help build cells. The liver makes all the cholesterol that a person's body needs. Having high cholesterol (hypercholesterolemia) increases your risk for heart disease and stroke. Extra or excess cholesterol comes from the food that you eat. High cholesterol can often be prevented with diet and lifestyle changes. If you already have high cholesterol, you can control it with diet, lifestyle changes, and medicines. How can high cholesterol affect me? If you have high cholesterol, fatty deposits (plaques) may build up on the walls of your blood vessels. The blood vessels that carry blood away from your heart are called arteries. Plaques make the arteries narrower and stiffer. This in turn can: Restrict or block blood flow and cause blood clots to form. Increase your risk for heart attack and stroke. What can increase my risk for high cholesterol? This condition is more likely to develop in people who: Eat foods that are high in saturated fat or cholesterol. Saturated fat is mostly found in foods that come from animal sources. Are overweight. Are not getting enough exercise. Use products that contain nicotine or tobacco, such as cigarettes, e-cigarettes, and chewing tobacco. Have a family history of high cholesterol (familial hypercholesterolemia). What actions can I take to prevent this? Nutrition  Eat less saturated fat. Avoid trans fats (partially hydrogenated oils). These are often found in margarine and in some baked goods, fried foods, and  snacks bought in packages. Avoid precooked or cured meat, such as bacon, sausages, or meat loaves. Avoid foods and drinks that have added sugars. Eat more fruits, vegetables, and whole grains. Choose healthy sources of protein, such as fish, poultry, lean cuts of red meat, beans, peas, lentils, and nuts. Choose healthy sources of fat, such as: Nuts. Vegetable oils, especially olive oil. Fish that have healthy fats, such as omega-3 fatty acids. These fish include mackerel or salmon. Lifestyle Lose weight if you are overweight. Maintaining a healthy body mass index (BMI) can help prevent or control high cholesterol. It can also lower your risk for diabetes and high blood pressure. Ask your health care provider to help you with a diet and exercise plan to lose weight safely. Do not use any products that contain nicotine or tobacco. These products include cigarettes, chewing tobacco, and vaping devices, such as e-cigarettes. If you need help quitting, ask your health care provider. Alcohol use Do not drink alcohol if: Your health care provider tells you not to drink. You are pregnant, may be pregnant, or are planning to become pregnant. If you drink alcohol: Limit how much you have to: 0-1 drink a day for women. 0-2 drinks a day for men. Know how much alcohol is in your drink. In the [place] one drink equals one 12 oz bottle of beer (355 mL), one 5 oz glass of wine (148 mL), or one 1 oz glass of hard liquor (44 mL). Activity  Get enough exercise. Do exercises as told by your health care provider. Each week, do at least 150 minutes of exercise that takes a medium level  of effort (moderate-intensity exercise). This kind of exercise: Makes your heart beat faster while allowing you to still be able to talk. Can be done in short sessions several times a day or longer sessions a few times a week. For example, on 5 days each week, you could walk fast or ride your bike 3 times a day for 10 minutes each  time. Medicines Your health care provider may recommend medicines to help lower cholesterol. This may be a medicine to lower the amount of cholesterol that your liver makes. You may need medicine if: Diet and lifestyle changes have not lowered your cholesterol enough. You have high cholesterol and other risk factors for heart disease or stroke. Take over-the-counter and prescription medicines only as told by your health care provider. General information Manage your risk factors for high cholesterol. Talk with your health care provider about all your risk factors and how to lower your risk. Manage other conditions that you have, such as diabetes or high blood pressure (hypertension). Have blood tests to check your cholesterol levels at regular points in time as told by your health care provider. Keep all follow-up visits. This is important. Where to find more information American Heart Association: www.heart.org National Heart, Lung, and Blood Institute: PopSte[employer].is Summary High cholesterol increases your risk for heart disease and stroke. By keeping your cholesterol level low, you can reduce your risk for these conditions. High cholesterol can often be prevented with diet and lifestyle changes. Work with your health care provider to manage your risk factors, and have your blood tested regularly. This information is not intended to replace advice given to you by your health care provider. Make sure you discuss any questions you have with your health care provider. Document Revised: 05/25/2020 Document Reviewed: 05/25/2020 Elsevier Patient Education  2022 [employer].

## 2021-11-25 ENCOUNTER — Ambulatory Visit (INDEPENDENT_AMBULATORY_CARE_PROVIDER_SITE_OTHER): Payer: No Typology Code available for payment source | Admitting: Family Medicine

## 2021-11-25 ENCOUNTER — Encounter: Payer: Self-pay | Admitting: Family Medicine

## 2021-11-25 VITALS — BP 97/64 | HR 59 | Temp 98.3°F | Ht 62.0 in | Wt 171.0 lb

## 2021-11-25 DIAGNOSIS — Z79899 Other long term (current) drug therapy: Secondary | ICD-10-CM

## 2021-11-25 DIAGNOSIS — Z23 Encounter for immunization: Secondary | ICD-10-CM | POA: Diagnosis not present

## 2021-11-25 DIAGNOSIS — E785 Hyperlipidemia, unspecified: Secondary | ICD-10-CM | POA: Diagnosis not present

## 2021-11-25 DIAGNOSIS — Z1231 Encounter for screening mammogram for malignant neoplasm of breast: Secondary | ICD-10-CM

## 2021-11-25 DIAGNOSIS — Z0000 Encounter for general adult medical examination without abnormal findings: Secondary | ICD-10-CM | POA: Diagnosis not present

## 2021-11-25 LAB — CBC
HCT: 43.3 % (ref 36.0–46.0)
Hemoglobin: 14.4 g/dL (ref 12.0–15.0)
MCHC: 33.2 g/dL (ref 30.0–36.0)
MCV: 89.6 fl (ref 78.0–100.0)
Platelets: 259 10*3/uL (ref 150.0–400.0)
RBC: 4.83 Mil/uL (ref 3.87–5.11)
RDW: 13.6 % (ref 11.5–15.5)
WBC: 4.5 10*3/uL (ref 4.0–10.5)

## 2021-11-25 LAB — COMPREHENSIVE METABOLIC PANEL
ALT: 32 U/L (ref 0–35)
AST: 18 U/L (ref 0–37)
Albumin: 4.3 g/dL (ref 3.5–5.2)
Alkaline Phosphatase: 77 U/L (ref 39–117)
BUN: 16 mg/dL (ref 6–23)
CO2: 26 mEq/L (ref 19–32)
Calcium: 9.4 mg/dL (ref 8.4–10.5)
Chloride: 103 mEq/L (ref 96–112)
Creatinine, Ser: 0.71 mg/dL (ref 0.40–1.20)
GFR: 104.38 mL/min (ref >60.00)
Glucose, Bld: 82 mg/dL (ref 70–99)
Potassium: 4 mEq/L (ref 3.5–5.1)
Sodium: 139 mEq/L (ref 135–145)
Total Bilirubin: 0.5 mg/dL (ref 0.2–1.2)
Total Protein: 7 g/dL (ref 6.0–8.3)

## 2021-11-25 LAB — LIPID PANEL
Cholesterol: 216 mg/dL — ABNORMAL HIGH (ref 0–200)
HDL: 51.1 mg/dL (ref >39.00)
LDL Cholesterol: 146 mg/dL — ABNORMAL HIGH (ref 0–99)
NonHDL: 165.36
Total CHOL/HDL Ratio: 4
Triglycerides: 95 mg/dL (ref 0.0–149.0)
VLDL: 19 mg/dL (ref 0.0–40.0)

## 2021-11-25 LAB — TSH: TSH: 2.01 u[IU]/mL (ref 0.35–5.50)

## 2021-11-25 LAB — HEMOGLOBIN A1C: Hgb A1c MFr Bld: 5.4 % (ref 4.6–6.5)

## 2021-11-25 NOTE — Progress Notes (Signed)
Patient ID: Alexandra Edwards, female  DOB: [birth year], 41 y.o.   MRN: [id number] Patient Care Team    Relationship Specialty Notifications Start End  Natalia Leatherwood, DO PCP - General Family Medicine  05/15/17   Pa, [facility]    05/15/17   Venita Sheffield, MD Referring Physician Obstetrics and Gynecology  04/09/18    Comment: [facility] in [place]- uncertain provider    Chief Complaint  Patient presents with   Annual Exam    Pt is fasting    Subjective: Alexandra Edwards is a 41 y.o.  Female  present for CPE  All past medical history, surgical history, allergies, family history, immunizations, medications and social history were updated in the electronic medical record today. All recent labs, ED visits and hospitalizations within the last year were reviewed.  Health maintenance:  Colonoscopy: no fhx, routine screen 43 Mammogram: no fhx, 11/2020 [facility].>ordered Cervical cancer screening: last pap: 05/15/2017, results: normal, completed by: Alvie Heidelberg Immunizations: tdap UTD 2021, Influenza completed today(encouraged yearly), covid completed Infectious disease screening: HIV completed 2003, hep C screening completed DEXA: routine screen Assistive device: None Oxygen use: None Patient has a Dental home. Hospitalizations/ED visits: Reviewed      03/01/2021    8:37 AM 11/24/2020    1:09 PM 11/21/2019    7:30 PM 04/09/2018    1:09 PM 06/09/2017    9:05 AM  Depression screen PHQ 2/9  Decreased Interest 0 0 0 0 0  Down, Depressed, Hopeless 0 0 0 0 0  PHQ - 2 Score 0 0 0 0 0  Altered sleeping    0 0  Tired, decreased energy    1 1  Change in appetite    0 1  Feeling bad or failure about yourself     0 0  Trouble concentrating    0 0  Moving slowly or fidgety/restless    0 0  Suicidal thoughts    0 0  PHQ-9 Score    1 2  Difficult doing work/chores    Not difficult at all       06/09/2017    9:06 AM 05/15/2017   11:07 AM  GAD 7 :  Generalized Anxiety Score  Nervous, Anxious, on Edge 0 2  Control/stop worrying 0 0  Worry too much - different things 0 1  Trouble relaxing 0 0  Restless 0 1  Easily annoyed or irritable 0 2  Afraid - awful might happen 0 1  Total GAD 7 Score 0 7    Immunization History  Administered Date(s) Administered   Influenza,inj,Quad PF,6+ Mos 05/15/2017, 04/09/2018, 12/31/2019, 11/24/2020, 11/25/2021   PFIZER(Purple Top)SARS-COV-2 Vaccination 10/24/2019, 11/14/2019   Tdap 12/31/2019    Past Medical History:  Diagnosis Date   Chicken pox    Depression    was on effexor; doing well off now   Frequent urinary tract infections    GERD (gastroesophageal reflux disease)    History of kidney stones    Migraine    Urinary incontinence    No Known Allergies Past Surgical History:  Procedure Laterality Date   APPENDECTOMY  2009   CESAREAN SECTION  2001, 2003   EXTRACORPOREAL SHOCK WAVE LITHOTRIPSY Left 11/28/2016   Procedure: EXTRACORPOREAL SHOCK WAVE LITHOTRIPSY (ESWL);  Surgeon: Crist Fat, MD;  Location: [facility];  Service: Urology;  Laterality: Left;  [phone] [id number]   EXTRACORPOREAL SHOCK WAVE LITHOTRIPSY Left 01/19/2017   Procedure: LEFT EXTRACORPOREAL SHOCK WAVE LITHOTRIPSY (  ESWL);  Surgeon: Malen Gauze, MD;  Location[facility];  Service: Urology;  Laterality: Left;   surgery to remove bladder polyps     2002   URETERAL STENT PLACEMENT Left 2002   urinary stent placement      secondary to kidney stones   Family History  Problem Relation Age of Onset   Hyperlipidemia Mother    Uterine cancer Mother    Diabetes Maternal Grandfather    Ulcers Paternal Grandmother    Arthritis Paternal Grandmother    Depression Paternal Grandmother    Asthma Son    Breast cancer Neg Hx    Social History   Social History Narrative   Ms Nodal lives w/husband & 2 sons. She relocated from [place].   College-educated. 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   Former smoker.   Drinks caffeine.   Smoke lamina home.   Wears her seatbelt.   Feels safe in her relationships.    Allergies as of 11/25/2021   No Known Allergies      Medication List        Accurate as of November 25, 2021  8:25 AM. If you have any questions, ask your nurse or doctor.          STOP taking these medications    omeprazole 20 MG capsule Commonly known as: PRILOSEC Stopped by: Felix Pacini, DO       TAKE these medications    atorvastatin 10 MG tablet Commonly known as: LIPITOR Take 1 tablet (10 mg total) by mouth at bedtime.   oxybutynin 10 MG 24 hr tablet Commonly known as: DITROPAN-XL Take 10 mg by mouth daily.        All past medical history, surgical history, allergies, family history, immunizations andmedications were updated in the EMR today and reviewed under the history and medication portions of their EMR.     No results found for this or any previous visit (from the past 2160 hour(s)).  CT Angio Chest Aorta W and/or Wo Contrast  Result Date: 01/19/2021 IMPRESSION: 1. CTA Chest is negative aside from mild atelectasis and gas trapping. No thoracic aortic dissection or aneurysm. 2. No acute or inflammatory process identified in the abdomen or pelvis. 3. Negative for hydronephrosis. Positive for chronic renal parapelvic cysts, and a 3 cm right upper pole renal angiomyolipoma which has not significantly changed since last year.   DG Chest 2 View Result Date: 01/19/2021 CLINICAL DATA:  Chest pain EXAM: CHEST - 2 VIEW COMPARISON:  None. FINDINGS: Somewhat low lung volumes. Cardiac and mediastinal contours are within normal limits. No focal pulmonary opacity. No pleural effusion or pneumothorax. No acute osseous abnormality. IMPRESSION: No acute cardiopulmonary process.   ROS 14 pt review of systems performed and negative (unless mentioned in an HPI)  Objective: BP 97/64   Pulse (!) 59   Temp 98.3 F (36.8 C) (Oral)   Ht 5\' 2"   (1.575 m)   Wt 171 lb (77.6 kg)   SpO2 98%   BMI 31.28 kg/m  Physical Exam Vitals and nursing note reviewed.  Constitutional:      General: She is not in acute distress.    Appearance: Normal appearance. She is not ill-appearing or toxic-appearing.  HENT:     Head: Normocephalic and atraumatic.     Right Ear: Tympanic membrane, ear canal and external ear normal. There is no impacted cerumen.     Left Ear: Tympanic membrane, ear canal and external ear normal. There is  no impacted cerumen.     Nose: No congestion or rhinorrhea.     Mouth/Throat:     Mouth: Mucous membranes are moist.     Pharynx: Oropharynx is clear. No oropharyngeal exudate or posterior oropharyngeal erythema.  Eyes:     General: No scleral icterus.       Right eye: No discharge.        Left eye: No discharge.     Extraocular Movements: Extraocular movements intact.     Conjunctiva/sclera: Conjunctivae normal.     Pupils: Pupils are equal, round, and reactive to light.  Cardiovascular:     Rate and Rhythm: Normal rate and regular rhythm.     Pulses: Normal pulses.     Heart sounds: Normal heart sounds. No murmur heard.    No friction rub. No gallop.  Pulmonary:     Effort: Pulmonary effort is normal. No respiratory distress.     Breath sounds: Normal breath sounds. No stridor. No wheezing, rhonchi or rales.  Chest:     Chest wall: No tenderness.  Abdominal:     General: Abdomen is flat. Bowel sounds are normal. There is no distension.     Palpations: Abdomen is soft. There is no mass.     Tenderness: There is no abdominal tenderness. There is no right CVA tenderness, left CVA tenderness, guarding or rebound.     Hernia: No hernia is present.  Musculoskeletal:        General: No swelling, tenderness or deformity. Normal range of motion.     Cervical back: Normal range of motion and neck supple. No rigidity or tenderness.     Right lower leg: No edema.     Left lower leg: No edema.  Lymphadenopathy:      Cervical: No cervical adenopathy.  Skin:    General: Skin is warm and dry.     Coloration: Skin is not jaundiced or pale.     Findings: No bruising, erythema, lesion or rash.  Neurological:     General: No focal deficit present.     Mental Status: She is alert and oriented to person, place, and time. Mental status is at baseline.     Cranial Nerves: No cranial nerve deficit.     Sensory: No sensory deficit.     Motor: No weakness.     Coordination: Coordination normal.     Gait: Gait normal.     Deep Tendon Reflexes: Reflexes normal.  Psychiatric:        Mood and Affect: Mood normal.        Behavior: Behavior normal.        Thought Content: Thought content normal.        Judgment: Judgment normal.      No results found.  Assessment/plan: Alexandra Edwards is a 41 y.o. female present for CPE  Hyperlipidemia LDL goal <130 Not currently taking her lipitor, but willing to restart if needed. She has lost over 40 lbs by diet and exercise.  - CBC - Comprehensive metabolic panel - Lipid panel - TSH Encounter for long-term current use of medication - Hemoglobin A1c Encounter for screening mammogram for malignant neoplasm of breast - MM 3D SCREEN BREAST BILATERAL; Future Need for immunization against influenza - Flu Vaccine QUAD 6+ mos PF IM (Fluarix Quad PF)  Routine general medical examination at a health care facility Colonoscopy: no fhx, routine screen 43 Mammogram: no fhx, 11/2020 [facility].>ordered Cervical cancer screening: last pap: 05/15/2017, results: normal, completed by: [facility]  [place] Immunizations: tdap UTD 2021, Influenza completed today(encouraged yearly), covid completed Infectious disease screening: HIV completed 2003, hep C screening completed DEXA: routine screen Patient was encouraged to exercise greater than 150 minutes a week. Patient was encouraged to choose a diet filled with fresh fruits and vegetables, and lean meats. AVS provided to patient today  for education/recommendation on gender specific health and safety maintenance. Return in about 1 year (around 11/27/2022) for cpe (20 min).    Orders Placed This Encounter  Procedures   MM 3D SCREEN BREAST BILATERAL   Flu Vaccine QUAD 6+ mos PF IM (Fluarix Quad PF)   CBC   Comprehensive metabolic panel   Hemoglobin A1c   Lipid panel   TSH   No orders of the defined types were placed in this encounter.  Referral Orders  No referral(s) requested today     Electronically signed by: Felix Pacini, DO [facility] [place]

## 2021-11-25 NOTE — Patient Instructions (Addendum)

## 2021-11-26 ENCOUNTER — Telehealth: Payer: Self-pay | Admitting: Family Medicine

## 2021-11-26 MED ORDER — ATORVASTATIN CALCIUM 10 MG PO TABS: 10.0000 mg | ORAL_TABLET | Freq: Every evening | ORAL | 3 refills | Status: DC

## 2021-11-26 NOTE — Telephone Encounter (Signed)
LVM for pt to CB regarding results.  

## 2021-11-26 NOTE — Telephone Encounter (Signed)
Please call patient Liver, kidney and thyroid function are normal Blood cell counts and electrolytes are normal  Cholesterol panel is high back up with an LDL of 146.  I encouraged her to restart the Lipitor and have called that in for her today.

## 2021-11-29 NOTE — Telephone Encounter (Signed)
Spoke with pt regarding labs and instructions.   

## 2021-12-08 ENCOUNTER — Ambulatory Visit
Admission: RE | Admit: 2021-12-08 | Discharge: 2021-12-08 | Disposition: A | Discharge status: Home / Self Care | Payer: No Typology Code available for payment source | Source: Ambulatory Visit | Attending: Family Medicine | Admitting: Family Medicine

## 2021-12-08 DIAGNOSIS — Z1231 Encounter for screening mammogram for malignant neoplasm of breast: Secondary | ICD-10-CM

## 2022-05-09 IMAGING — CT CT ABD-PELV W/O CM
2 of 4 series · 15 of 46 positions shown, 17 images · IV contrast (omnipaque)
Comparison: Noncontrast CT Abdomen and Pelvis
[DATE]. Chest radiographs 3393 hours today.

CLINICAL DATA: 40-year-old female with chest and back pain. Mid
chest pain radiating to the back. Questionable hydronephrosis on
initial CTA chest images, so CT Abdomen and Pelvis was added.

EXAM:
CT ANGIOGRAPHY CHEST
CT ABDOMEN AND PELVIS WITH CONTRAST
TECHNIQUE: Noncontrast chest CT 1st acquired. Multidetector CT imaging of the
chest was performed using the standard protocol during bolus
administration of intravenous contrast. Multiplanar CT image
reconstructions and MIPs were obtained to evaluate the vascular
anatomy. Multidetector CT imaging of the abdomen and pelvis was
performed using the standard protocol during bolus administration of
intravenous contrast.
CONTRAST:  100mL OMNIPAQUE IOHEXOL 350 MG/ML SOLN

[Series 2: abd pel wo · axial · 0.92mm/px · z∈[-401,+34]mm · 12 of 95 slices shown, 14 images]
[im 4/95  soft-tissue]
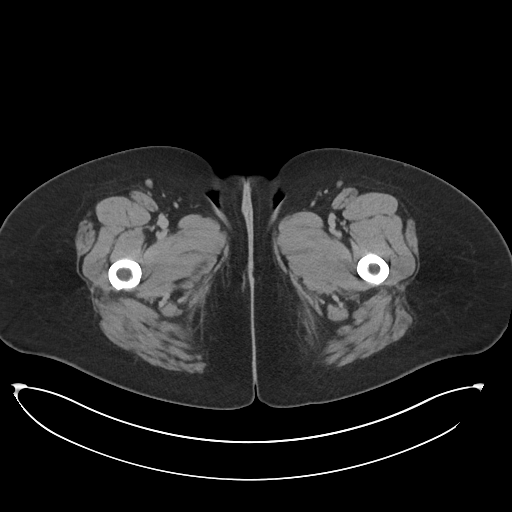
[im 4/95  bone]
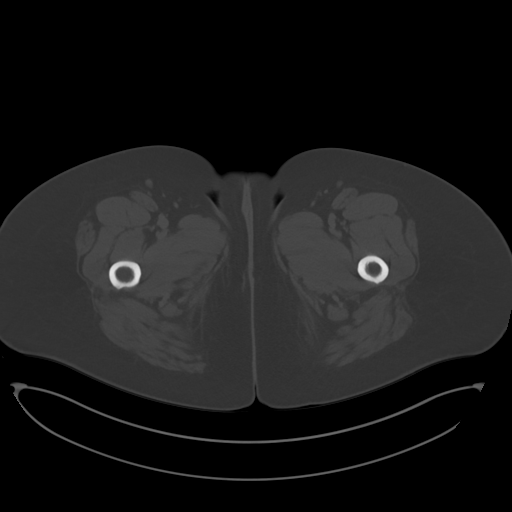
[im 12/95  soft-tissue]
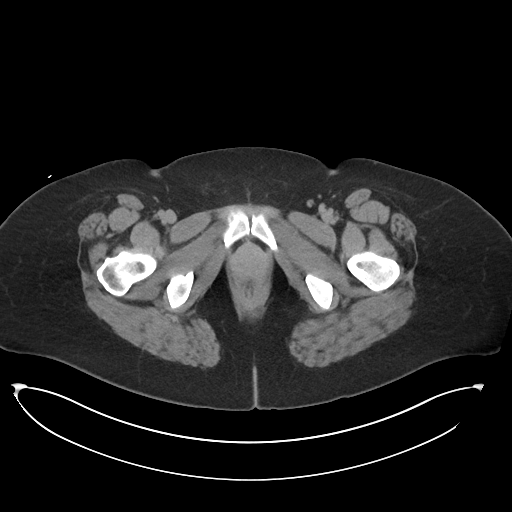
[im 20/95  soft-tissue]
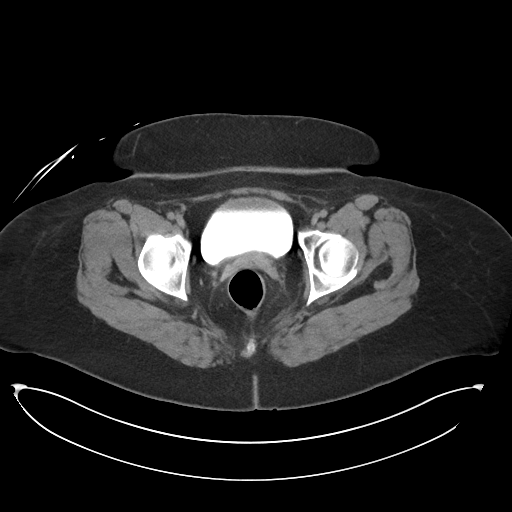
[im 28/95  soft-tissue]
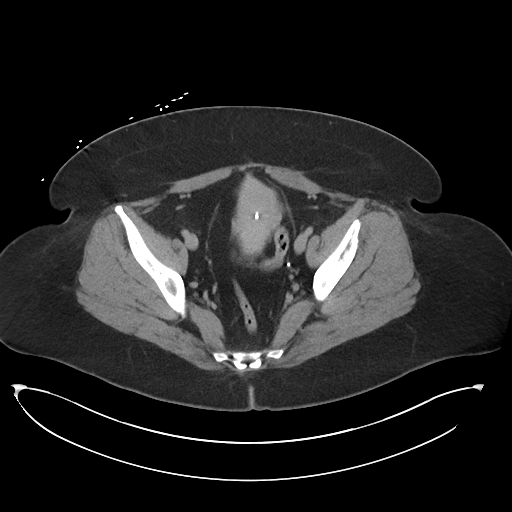
[im 36/95  soft-tissue]
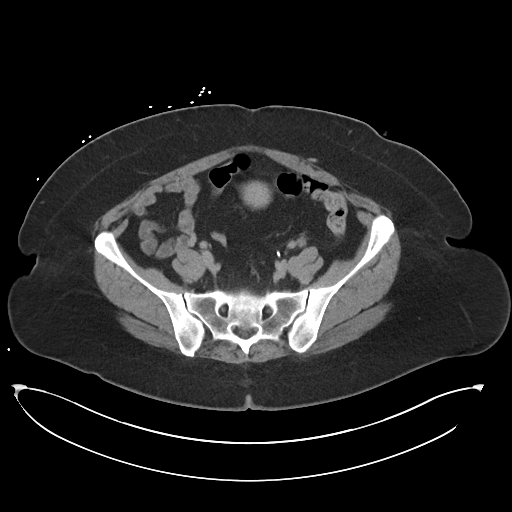
[im 44/95  soft-tissue]
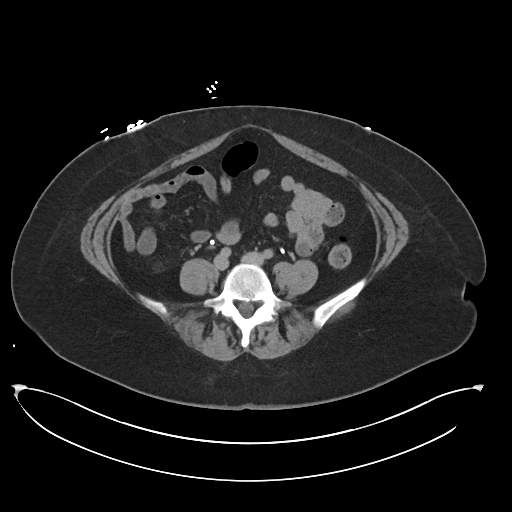
[im 51/95  soft-tissue]
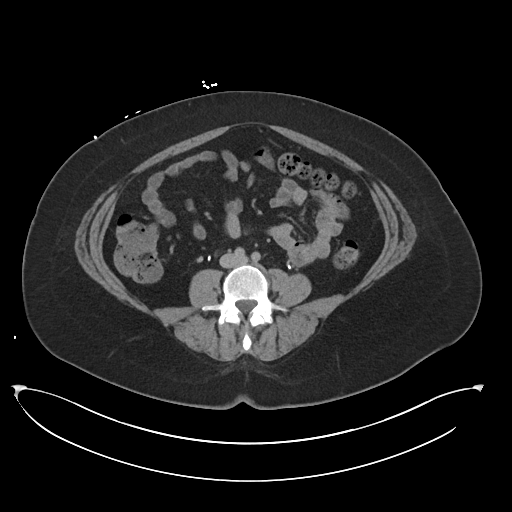
[im 59/95  soft-tissue]
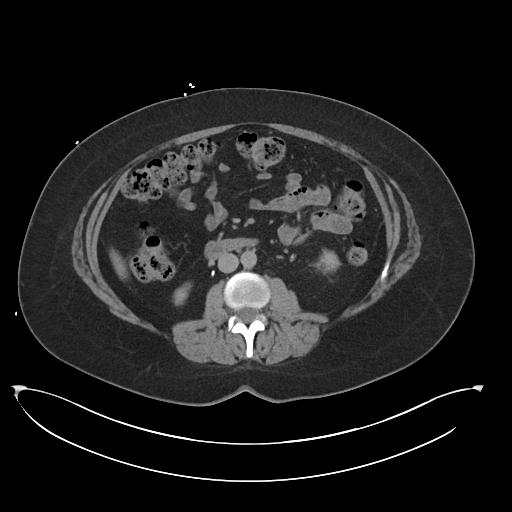
[im 67/95  soft-tissue]
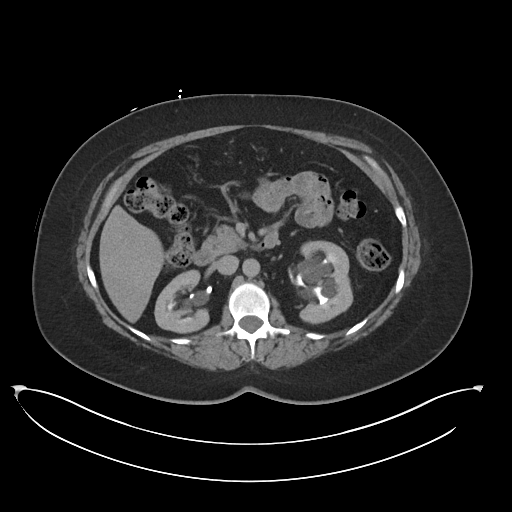
[im 67/95  bone]
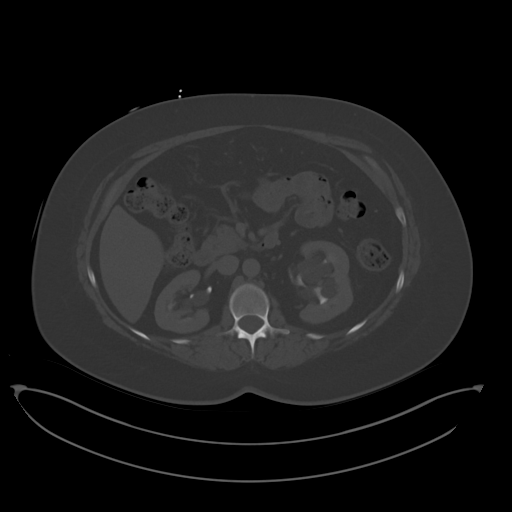
[im 75/95  soft-tissue]
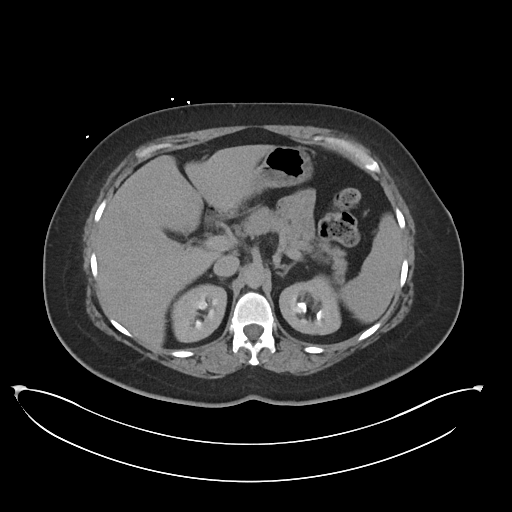
[im 83/95  soft-tissue]
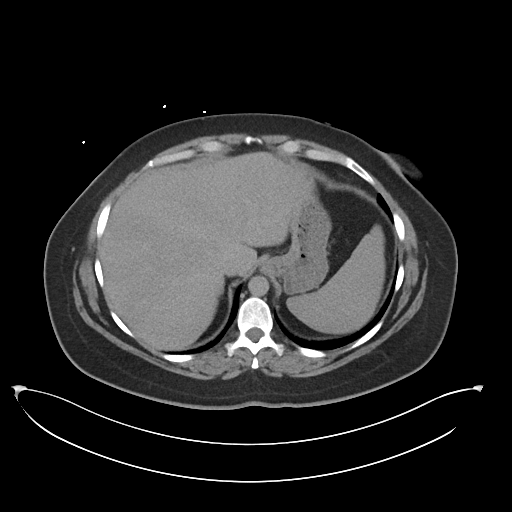
[im 91/95  soft-tissue]
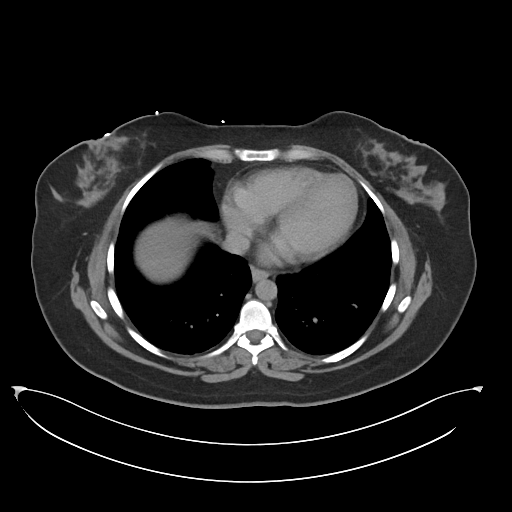

[Series 5: coronal · coronal · 0.81mm/px · 3 of 103 slices shown]
[im 35/103  soft-tissue]
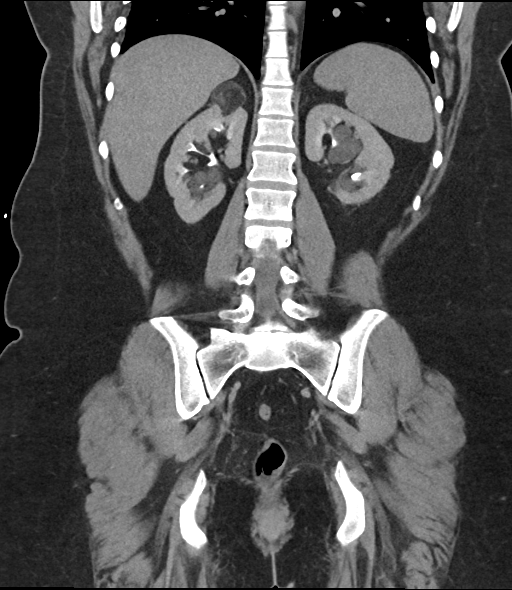
[im 46/103  soft-tissue]
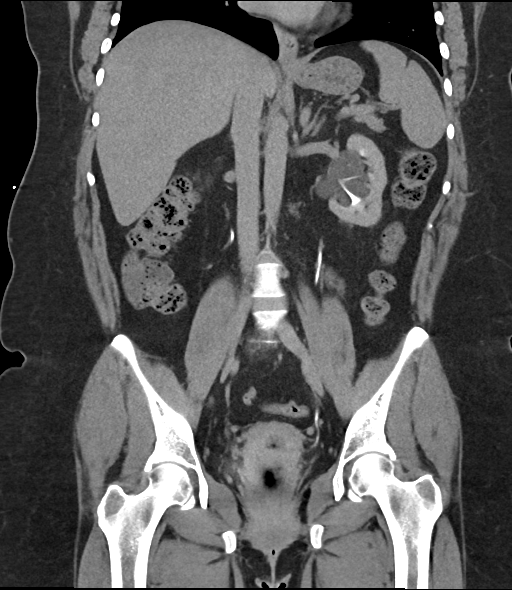
[im 57/103  soft-tissue]
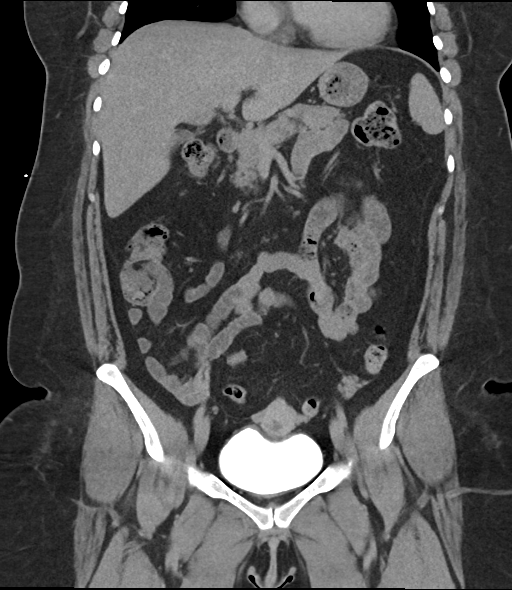

[15 of 46 positions shown; findings below may reference images not displayed]

FINDINGS: CTA CHEST FINDINGS

Cardiovascular: Good aortic contrast timing. Negative for thoracic
aortic aneurysm or dissection. Central pulmonary arteries also
opacified and appear to be patent. Cardiac size at the upper limits
of normal. No pericardial effusion. Proximal great vessels appear
patent, mildly tortuous.

Mediastinum/Nodes: Negative.  No mediastinal lymphadenopathy.

Lungs/Pleura: Somewhat low lung volumes. Major airways are patent.
Symmetric mostly dependent atelectasis in both lungs. Some
superimposed mosaic attenuation in the lower lobes compatible with
gas trapping. No pleural effusion, and lungs otherwise appear
negative.

Musculoskeletal: Negative.

Review of the MIP images confirms the above findings.

CT ABDOMEN and PELVIS FINDINGS

Hepatobiliary: Negative liver and gallbladder.

Pancreas: Negative.

Spleen: Negative.

Adrenals/Urinary Tract: Normal left adrenal gland. Delayed renal
contrast imaging only. Negative for hydronephrosis, but positive for
chronic bilateral benign renal parapelvic cysts. These have mildly
increased on the right since 6054. And there is a superimposed
exophytic roughly 3 cm lesion arising from the right upper pole with
macroscopic fat consistent with angiomyolipoma. Size not
significantly changed from last year. No adjacent inflammation.

Symmetric renal contrast excretion. Both ureters appear normal.
Unremarkable urinary bladder containing excreted contrast. Punctate
nephrolithiasis which was greater on the right is poorly identified
today due to contrast.

Stomach/Bowel: Negative large bowel aside from mild redundancy and
retained stool. Prior appendectomy on series 2, image 50. Terminal
ileum appears negative. No dilated small bowel. Negative stomach and
duodenum. No free air, free fluid or mesenteric inflammation.

Vascular/Lymphatic: Delayed contrast timing only in the abdomen and
pelvis due to the order of image acquisition. Grossly patent major
vascular structures. No lymphadenopathy identified.

Reproductive: Stable since 6054, including IUD.

Other: No pelvic free fluid.

Musculoskeletal: Chronic L5-S1 disc and endplate degeneration. No
acute osseous abnormality identified.

Review of the MIP images confirms the above findings.
IMPRESSION: 1. CTA Chest is negative aside from mild atelectasis and gas
trapping. No thoracic aortic dissection or aneurysm.

2. No acute or inflammatory process identified in the abdomen or
pelvis.

3. Negative for hydronephrosis. Positive for chronic renal
parapelvic cysts, and a 3 cm right upper pole renal angiomyolipoma
which has not significantly changed since last year.

## 2022-10-25 ENCOUNTER — Other Ambulatory Visit: Payer: Self-pay | Admitting: Family Medicine

## 2022-10-25 DIAGNOSIS — Z1231 Encounter for screening mammogram for malignant neoplasm of breast: Secondary | ICD-10-CM

## 2022-11-30 ENCOUNTER — Encounter: Payer: No Typology Code available for payment source | Admitting: Family Medicine

## 2022-12-12 ENCOUNTER — Ambulatory Visit: Payer: No Typology Code available for payment source

## 2022-12-13 ENCOUNTER — Ambulatory Visit: Payer: No Typology Code available for payment source

## 2022-12-19 ENCOUNTER — Ambulatory Visit
Admission: RE | Admit: 2022-12-19 | Discharge: 2022-12-19 | Disposition: A | Discharge status: Home / Self Care | Payer: Managed Care, Other (non HMO) | Source: Ambulatory Visit | Attending: Family Medicine | Admitting: Family Medicine

## 2022-12-19 DIAGNOSIS — Z1231 Encounter for screening mammogram for malignant neoplasm of breast: Secondary | ICD-10-CM

## 2023-01-26 ENCOUNTER — Encounter: Payer: No Typology Code available for payment source | Admitting: Family Medicine

## 2023-03-27 ENCOUNTER — Ambulatory Visit (INDEPENDENT_AMBULATORY_CARE_PROVIDER_SITE_OTHER): Payer: Managed Care, Other (non HMO) | Admitting: Family Medicine

## 2023-03-27 ENCOUNTER — Encounter: Payer: Self-pay | Admitting: Family Medicine

## 2023-03-27 VITALS — BP 90/62 | HR 68 | Temp 97.9°F | Ht 62.0 in | Wt 144.8 lb

## 2023-03-27 DIAGNOSIS — Z1231 Encounter for screening mammogram for malignant neoplasm of breast: Secondary | ICD-10-CM

## 2023-03-27 DIAGNOSIS — E785 Hyperlipidemia, unspecified: Secondary | ICD-10-CM | POA: Diagnosis not present

## 2023-03-27 DIAGNOSIS — Z79899 Other long term (current) drug therapy: Secondary | ICD-10-CM | POA: Diagnosis not present

## 2023-03-27 DIAGNOSIS — Z7689 Persons encountering health services in other specified circumstances: Secondary | ICD-10-CM

## 2023-03-27 DIAGNOSIS — E669 Obesity, unspecified: Secondary | ICD-10-CM | POA: Diagnosis not present

## 2023-03-27 DIAGNOSIS — Z131 Encounter for screening for diabetes mellitus: Secondary | ICD-10-CM

## 2023-03-27 DIAGNOSIS — Z0000 Encounter for general adult medical examination without abnormal findings: Secondary | ICD-10-CM | POA: Diagnosis not present

## 2023-03-27 DIAGNOSIS — Z23 Encounter for immunization: Secondary | ICD-10-CM

## 2023-03-27 LAB — COMPREHENSIVE METABOLIC PANEL
ALT: 43 U/L — ABNORMAL HIGH (ref 0–35)
AST: 23 U/L (ref 0–37)
Albumin: 3.9 g/dL (ref 3.5–5.2)
Alkaline Phosphatase: 64 U/L (ref 39–117)
BUN: 15 mg/dL (ref 6–23)
CO2: 28 meq/L (ref 19–32)
Calcium: 9.2 mg/dL (ref 8.4–10.5)
Chloride: 105 meq/L (ref 96–112)
Creatinine, Ser: 0.72 mg/dL (ref 0.40–1.20)
GFR: 101.69 mL/min (ref >60.00)
Glucose, Bld: 82 mg/dL (ref 70–99)
Potassium: 4.2 meq/L (ref 3.5–5.1)
Sodium: 140 meq/L (ref 135–145)
Total Bilirubin: 0.6 mg/dL (ref 0.2–1.2)
Total Protein: 6.6 g/dL (ref 6.0–8.3)

## 2023-03-27 LAB — CBC
HCT: 42.1 % (ref 36.0–46.0)
Hemoglobin: 14.1 g/dL (ref 12.0–15.0)
MCHC: 33.4 g/dL (ref 30.0–36.0)
MCV: 91.9 fL (ref 78.0–100.0)
Platelets: 278 10*3/uL (ref 150.0–400.0)
RBC: 4.59 Mil/uL (ref 3.87–5.11)
RDW: 13.1 % (ref 11.5–15.5)
WBC: 4.1 10*3/uL (ref 4.0–10.5)

## 2023-03-27 LAB — LIPID PANEL
Cholesterol: 193 mg/dL (ref 0–200)
HDL: 57.2 mg/dL (ref >39.00)
LDL Cholesterol: 124 mg/dL — ABNORMAL HIGH (ref 0–99)
NonHDL: 135.67
Total CHOL/HDL Ratio: 3
Triglycerides: 60 mg/dL (ref 0.0–149.0)
VLDL: 12 mg/dL (ref 0.0–40.0)

## 2023-03-27 LAB — TSH: TSH: 1.75 u[IU]/mL (ref 0.35–5.50)

## 2023-03-27 LAB — HEMOGLOBIN A1C: Hgb A1c MFr Bld: 5.3 % (ref 4.6–6.5)

## 2023-03-27 MED ORDER — ATORVASTATIN CALCIUM 10 MG PO TABS: 10.0000 mg | ORAL_TABLET | Freq: Every evening | ORAL | 3 refills | Status: AC

## 2023-03-27 NOTE — Progress Notes (Signed)
Patient ID: Alexandra Edwards, female  DOB: [birth year], 42 y.o.   MRN: [id number] Patient Care Team    Relationship Specialty Notifications Start End  Natalia Leatherwood, DO PCP - General Family Medicine  05/15/17   Pa, [facility]    05/15/17   Venita Sheffield, MD Referring Physician Obstetrics and Gynecology  04/09/18    Comment: [facility] in [place]- uncertain provider    Chief Complaint  Patient presents with   Annual Exam    Pt is fasting; [facility]    Subjective: Alexandra Edwards is a 42 y.o.  Female  present for CPE  All past medical history, surgical history, allergies, family history, immunizations, medications and social history were updated in the electronic medical record today. All recent labs, ED visits and hospitalizations within the last year were reviewed.  Health maintenance:  Colonoscopy: no fhx, routine screen 43 Mammogram: no fhx, completed 12/19/2022 [facility].>ordered 2025 Cervical cancer screening: last pap: 05/15/2017, results: normal, completed by: Alvie Heidelberg.  Requested records from most up-to-date Pap Immunizations: tdap UTD 2021, Influenza-completed today (encouraged yearly), covid completed Infectious disease screening: HIV completed 2003, hep C screening completed DEXA: routine screen to start at age 6 Assistive device: None Oxygen use: None Patient has a Dental home. Hospitalizations/ED visits: Reviewed      03/01/2021    8:37 AM 11/24/2020    1:09 PM 11/21/2019    7:30 PM 04/09/2018    1:09 PM 06/09/2017    9:05 AM  Depression screen PHQ 2/9  Decreased Interest 0 0 0 0 0  Down, Depressed, Hopeless 0 0 0 0 0  PHQ - 2 Score 0 0 0 0 0  Altered sleeping    0 0  Tired, decreased energy    1 1  Change in appetite    0 1  Feeling bad or failure about yourself     0 0  Trouble concentrating    0 0  Moving slowly or fidgety/restless    0 0  Suicidal thoughts    0 0  PHQ-9 Score    1 2  Difficult doing work/chores     Not difficult at all       06/09/2017    9:06 AM 05/15/2017   11:07 AM  GAD 7 : Generalized Anxiety Score  Nervous, Anxious, on Edge 0 2  Control/stop worrying 0 0  Worry too much - different things 0 1  Trouble relaxing 0 0  Restless 0 1  Easily annoyed or irritable 0 2  Afraid - awful might happen 0 1  Total GAD 7 Score 0 7    Immunization History  Administered Date(s) Administered   Influenza Inj Mdck Quad Pf 05/05/2016   Influenza, Seasonal, Injecte, Preservative Fre 03/27/2023   Influenza,inj,Quad PF,6+ Mos 05/15/2017, 04/09/2018, 12/31/2019, 11/24/2020, 11/25/2021   PFIZER(Purple Top)SARS-COV-2 Vaccination 10/24/2019, 11/14/2019   Tdap 12/31/2019    Past Medical History:  Diagnosis Date   Chicken pox    Depression    was on effexor; doing well off now   Frequent urinary tract infections    GERD (gastroesophageal reflux disease)    History of kidney stones    Migraine    Urinary incontinence    No Known Allergies Past Surgical History:  Procedure Laterality Date   APPENDECTOMY  2009   CESAREAN SECTION  2001, 2003   EXTRACORPOREAL SHOCK WAVE LITHOTRIPSY Left 11/28/2016   Procedure: EXTRACORPOREAL SHOCK WAVE LITHOTRIPSY (ESWL);  Surgeon: Crist Fat,  MD;  Location: [facility];  Service: Urology;  Laterality: Left;  [phone] [id number]   EXTRACORPOREAL SHOCK WAVE LITHOTRIPSY Left 01/19/2017   Procedure: LEFT EXTRACORPOREAL SHOCK WAVE LITHOTRIPSY (ESWL);  Surgeon: Malen Gauze, MD;  Location: [facility];  Service: Urology;  Laterality: Left;   surgery to remove bladder polyps     2002   URETERAL STENT PLACEMENT Left 2002   urinary stent placement      secondary to kidney stones   Family History  Problem Relation Age of Onset   Hyperlipidemia Mother    Uterine cancer Mother    Diabetes Maternal Grandfather    Ulcers Paternal Grandmother    Arthritis Paternal Grandmother    Depression Paternal Grandmother    Asthma Son    Breast  cancer Neg Hx    Social History   Social History Narrative   Alexandra Edwards lives w/husband & 2 sons. She relocated from [place].   College-educated. 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   Former smoker.   Drinks caffeine.   Smoke lamina home.   Wears her seatbelt.   Feels safe in her relationships.    Allergies as of 03/27/2023   No Known Allergies      Medication List        Accurate as of March 27, 2023  9:03 AM. If you have any questions, ask your nurse or doctor.          atorvastatin 10 MG tablet Commonly known as: LIPITOR Take 1 tablet (10 mg total) by mouth at bedtime.   oxybutynin 10 MG 24 hr tablet Commonly known as: DITROPAN-XL Take 10 mg by mouth daily.        All past medical history, surgical history, allergies, family history, immunizations andmedications were updated in the EMR today and reviewed under the history and medication portions of their EMR.     No results found for this or any previous visit (from the past 2160 hours).    ROS 14 pt review of systems performed and negative (unless mentioned in an HPI)  Objective: BP 90/62   Pulse 68   Temp 97.9 F (36.6 C)   Ht 5\' 2"  (1.575 m)   Wt 144 lb 12.8 oz (65.7 kg)   SpO2 98%   BMI 26.48 kg/m  Physical Exam Vitals and nursing note reviewed.  Constitutional:      General: She is not in acute distress.    Appearance: Normal appearance. She is obese. She is not ill-appearing or toxic-appearing.  HENT:     Head: Normocephalic and atraumatic.     Right Ear: Tympanic membrane, ear canal and external ear normal. There is no impacted cerumen.     Left Ear: Tympanic membrane, ear canal and external ear normal. There is no impacted cerumen.     Nose: No congestion or rhinorrhea.     Mouth/Throat:     Mouth: Mucous membranes are moist.     Pharynx: Oropharynx is clear. No oropharyngeal exudate or posterior oropharyngeal erythema.  Eyes:     General: No scleral icterus.       Right  eye: No discharge.        Left eye: No discharge.     Extraocular Movements: Extraocular movements intact.     Conjunctiva/sclera: Conjunctivae normal.     Pupils: Pupils are equal, round, and reactive to light.  Cardiovascular:     Rate and Rhythm: Normal rate and regular rhythm.     Pulses: Normal  pulses.     Heart sounds: Normal heart sounds. No murmur heard.    No friction rub. No gallop.  Pulmonary:     Effort: Pulmonary effort is normal. No respiratory distress.     Breath sounds: Normal breath sounds. No stridor. No wheezing, rhonchi or rales.  Chest:     Chest wall: No tenderness.  Abdominal:     General: Abdomen is flat. Bowel sounds are normal. There is no distension.     Palpations: Abdomen is soft. There is no mass.     Tenderness: There is no abdominal tenderness. There is no right CVA tenderness, left CVA tenderness, guarding or rebound.     Hernia: No hernia is present.  Musculoskeletal:        General: No swelling, tenderness or deformity. Normal range of motion.     Cervical back: Normal range of motion and neck supple. No rigidity or tenderness.     Right lower leg: No edema.     Left lower leg: No edema.  Lymphadenopathy:     Cervical: No cervical adenopathy.  Skin:    General: Skin is warm and dry.     Coloration: Skin is not jaundiced or pale.     Findings: No bruising, erythema, lesion or rash.  Neurological:     General: No focal deficit present.     Mental Status: She is alert and oriented to person, place, and time. Mental status is at baseline.     Cranial Nerves: No cranial nerve deficit.     Sensory: No sensory deficit.     Motor: No weakness.     Coordination: Coordination normal.     Gait: Gait normal.     Deep Tendon Reflexes: Reflexes normal.  Psychiatric:        Mood and Affect: Mood normal.        Behavior: Behavior normal.        Thought Content: Thought content normal.        Judgment: Judgment normal.      No results  found.  Assessment/plan: Kealee Heinly is a 42 y.o. female present for CPE  Hyperlipidemia LDL goal <130/obesity Continue Lipitor 10 mg nightly Lipids collected today A1c collected today  Constipation: Increase water to at least 64 ounces a day (only taking in about 32 oz) Senakot at bedtime and/or miralax 1/2-1 cap daily.  Averages 2-3 BM/weekly  Routine general medical examination at a health care facility Patient was encouraged to exercise greater than 150 minutes a week. Patient was encouraged to choose a diet filled with fresh fruits and vegetables, and lean meats. AVS provided to patient today for education/recommendation on gender specific health and safety maintenance. Colonoscopy: no fhx, routine screen 43 Mammogram: no fhx, completed 12/19/2022 [facility].>ordered 2025 Cervical cancer screening: last pap: 05/15/2017, results: normal, completed by: Alvie Heidelberg.  Requested records from most up-to-date Pap Immunizations: tdap UTD 2021, Influenza-completed today(encouraged yearly), covid completed Infectious disease screening: HIV completed 2003, hep C screening completed DEXA: routine screen to start at age 44  Return in about 1 year (around 03/27/2024) for cpe (20 min), Routine chronic condition follow-up.    Orders Placed This Encounter  Procedures   MM 3D SCREENING MAMMOGRAM BILATERAL BREAST   Flu vaccine trivalent PF, 6mos and older(Flulaval,Afluria,Fluarix,Fluzone)   CBC   Comp Met (CMET)   TSH   Hemoglobin A1c   Lipid panel   Ambulatory referral to Obstetrics / Gynecology   Meds ordered this encounter  Medications  atorvastatin (LIPITOR) 10 MG tablet    Sig: Take 1 tablet (10 mg total) by mouth at bedtime.    Dispense:  90 tablet    Refill:  3   Referral Orders         Ambulatory referral to Obstetrics / Gynecology       Electronically signed by: Felix Pacini, DO [facility] [place]

## 2023-03-27 NOTE — Patient Instructions (Addendum)
Return in about 1 year (around 03/27/2024) for cpe (20 min), Routine chronic condition follow-up.        Great to see you today.  I have refilled the medication(s) we provide.   If labs were collected or images ordered, we will inform you of  results once we have received them and reviewed. We will contact you either by echart message, or telephone call.  Please give ample time to the testing facility, and our office to run,  receive and review results. Please do not call inquiring of results, even if you can see them in your chart. We will contact you as soon as we are able. If it has been over 1 week since the test was completed, and you have not yet heard from [place], then please call us.    - echart message- for normal results that have been seen by the patient already.   - telephone call: abnormal results or if patient has not viewed results in their echart.  If a referral to a specialist was entered for you, please call us in 2 weeks if you have not heard from the specialist office to schedule.

## 2023-04-21 ENCOUNTER — Encounter (HOSPITAL_BASED_OUTPATIENT_CLINIC_OR_DEPARTMENT_OTHER): Payer: Managed Care, Other (non HMO) | Admitting: Certified Nurse Midwife

## 2023-05-11 NOTE — Progress Notes (Deleted)
 43 y.o. G3P2 Married White or Caucasian female here for annual exam.    No LMP recorded. (Menstrual status: IUD).          Sexually active: {yes no:314532}  The current method of family planning is {contraception:315051}.     The pregnancy intention screening data noted above was reviewed. Potential methods of contraception were discussed. The patient elected to proceed with No data recorded.  Exercising: {yes no:314532}  {types:19826} Smoker:  {YES NO:22349}  Health Maintenance: Pap:  *** History of abnormal Pap:  {YES NO:22349} MMG:  *** Colonoscopy:  *** BMD:   *** Screening Labs: ***   reports that she has quit smoking. Her smoking use included cigarettes. She has a 1.5 pack-year smoking history. She has never been exposed to tobacco smoke. She has never used smokeless tobacco. She reports that she does not drink alcohol and does not use drugs.  Past Medical History:  Diagnosis Date   Chicken pox    Depression    was on effexor; doing well off now   Frequent urinary tract infections    GERD (gastroesophageal reflux disease)    History of kidney stones    Migraine    Urinary incontinence     Past Surgical History:  Procedure Laterality Date   APPENDECTOMY  2009   CESAREAN SECTION  2001, 2003   EXTRACORPOREAL SHOCK WAVE LITHOTRIPSY Left 11/28/2016   Procedure: EXTRACORPOREAL SHOCK WAVE LITHOTRIPSY (ESWL);  Surgeon: Crist Fat, MD;  Location: [facility];  Service: Urology;  Laterality: Left;  [phone] [id number]   EXTRACORPOREAL SHOCK WAVE LITHOTRIPSY Left 01/19/2017   Procedure: LEFT EXTRACORPOREAL SHOCK WAVE LITHOTRIPSY (ESWL);  Surgeon: Malen Gauze, MD;  Location: [facility];  Service: Urology;  Laterality: Left;   surgery to remove bladder polyps     2002   URETERAL STENT PLACEMENT Left 2002   urinary stent placement      secondary to kidney stones    Current Outpatient Medications  Medication Sig Dispense Refill   atorvastatin  (LIPITOR) 10 MG tablet Take 1 tablet (10 mg total) by mouth at bedtime. 90 tablet 3   oxybutynin (DITROPAN-XL) 10 MG 24 hr tablet Take 10 mg by mouth daily.     No current facility-administered medications for this visit.    Family History  Problem Relation Age of Onset   Hyperlipidemia Mother    Uterine cancer Mother    Diabetes Maternal Grandfather    Ulcers Paternal Grandmother    Arthritis Paternal Grandmother    Depression Paternal Grandmother    Asthma Son    Breast cancer Neg Hx     ROS: Constitutional: {Findings; ROS constitutional:30497::"negative"} Genitourinary:{Findings; ROS genitourinary:19593::"negative"}  Exam:   There were no vitals taken for this visit.     General appearance: alert, cooperative and appears stated age Head: Normocephalic, without obvious abnormality, atraumatic Neck: no adenopathy, supple, symmetrical, trachea midline and thyroid {EXAM; THYROID:18604} Lungs: clear to auscultation bilaterally Breasts: {Exam; breast:13139::"normal appearance, no masses or tenderness"} Heart: regular rate and rhythm Abdomen: soft, non-tender; bowel sounds normal; no masses,  no organomegaly Extremities: extremities normal, atraumatic, no cyanosis or edema Skin: Skin color, texture, turgor normal. No rashes or lesions Lymph nodes: Cervical, supraclavicular, and axillary nodes normal. No abnormal inguinal nodes palpated Neurologic: Grossly normal   Pelvic: External genitalia:  no lesions              Urethra:  normal appearing urethra with no masses, tenderness or lesions  Bartholins and Skenes: normal                 Vagina: normal appearing vagina with normal color and no discharge, no lesions              Cervix: {exam; cervix:14595}              Pap taken: {yes no:314532} Bimanual Exam:  Uterus:  {exam; uterus:12215}              Adnexa: {exam; adnexa:12223}               Rectovaginal: Confirms               Anus:  normal sphincter tone, no  lesions  Chaperone, ***, CMA, was present for exam.  Assessment/Plan:

## 2023-05-15 ENCOUNTER — Encounter (HOSPITAL_BASED_OUTPATIENT_CLINIC_OR_DEPARTMENT_OTHER): Payer: Managed Care, Other (non HMO) | Admitting: Certified Nurse Midwife

## 2023-05-17 ENCOUNTER — Other Ambulatory Visit: Payer: Self-pay | Admitting: Urology

## 2023-05-17 DIAGNOSIS — D1771 Benign lipomatous neoplasm of kidney: Secondary | ICD-10-CM

## 2023-05-31 ENCOUNTER — Ambulatory Visit
Admission: RE | Admit: 2023-05-31 | Discharge: 2023-05-31 | Disposition: A | Discharge status: Home / Self Care | Payer: Managed Care, Other (non HMO) | Source: Ambulatory Visit | Attending: Urology | Admitting: Urology

## 2023-05-31 DIAGNOSIS — D1771 Benign lipomatous neoplasm of kidney: Secondary | ICD-10-CM

## 2023-05-31 NOTE — Consult Note (Signed)
 Chief Complaint: Patient was seen in consultation today for Enlarging renal angiomyolipoma at the request of Pace,Maryellen D  Referring Physician(s): Pace,Maryellen D  History of Present Illness: Alexandra Edwards is a 43 y.o. female with history of nephrolithiasis, presented  02/29/20 presented with flank pain, CT demonstrated 2.5cm RUP angiomyolipoma, previously 1cm on 11/08/2016, as well as R hydro from 3mm ureteral calculus 01/19/21 CT for back pain demonstrates 3cm AML, no hydronephrosis 05/05/23 [place] at [facility] noted urolithiasis, no hydro, but RUL AML now 4.8cm max diameter. Presents to discuss treatment options.    Past Medical History:  Diagnosis Date   Chicken pox    Depression    was on effexor; doing well off now   Frequent urinary tract infections    GERD (gastroesophageal reflux disease)    History of kidney stones    Migraine    Urinary incontinence     Past Surgical History:  Procedure Laterality Date   APPENDECTOMY  2009   CESAREAN SECTION  2001, 2003   EXTRACORPOREAL SHOCK WAVE LITHOTRIPSY Left 11/28/2016   Procedure: EXTRACORPOREAL SHOCK WAVE LITHOTRIPSY (ESWL);  Surgeon: Crist Fat, MD;  Location: [facility];  Service: Urology;  Laterality: Left;  [phone] [id number]   EXTRACORPOREAL SHOCK WAVE LITHOTRIPSY Left 01/19/2017   Procedure: LEFT EXTRACORPOREAL SHOCK WAVE LITHOTRIPSY (ESWL);  Surgeon: Malen Gauze, MD;  Location: [facility];  Service: Urology;  Laterality: Left;   surgery to remove bladder polyps     2002   URETERAL STENT PLACEMENT Left 2002   urinary stent placement      secondary to kidney stones    Allergies: Patient has no known allergies.  Medications: Prior to Admission medications   Medication Sig Start Date End Date Taking? Authorizing Provider  atorvastatin (LIPITOR) 10 MG tablet Take 1 tablet (10 mg total) by mouth at bedtime. 03/27/23   Kuneff, Renee A, DO  oxybutynin  (DITROPAN-XL) 10 MG 24 hr tablet Take 10 mg by mouth daily. 11/12/19   [provider]     Family History  Problem Relation Age of Onset   Hyperlipidemia Mother    Uterine cancer Mother    Diabetes Maternal Grandfather    Ulcers Paternal Grandmother    Arthritis Paternal Grandmother    Depression Paternal Grandmother    Asthma Son    Breast cancer Neg Hx     Social History   Socioeconomic History   Marital status: Married    Spouse name: Not on file   Number of children: 2   Years of education: Not on file   Highest education level: Not on file  Occupational History   Occupation: Accoiunting  Tobacco Use   Smoking status: Former    Current packs/day: 0.25    Average packs/day: 0.3 packs/day for 6.0 years (1.5 ttl pk-yrs)    Types: Cigarettes    Passive exposure: Never   Smokeless tobacco: Never  Vaping Use   Vaping status: Never Used  Substance and Sexual Activity   Alcohol use: No    Comment: socially   Drug use: No   Sexual activity: Yes    Partners: Male    Birth control/protection: I.U.D.  Other Topics Concern   Not on file  Social History Narrative   Ms Norbeck lives w/husband & 2 sons. She relocated from [place].   College-educated. 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   Former smoker.   Drinks caffeine.   Smoke lamina home.   Wears her  seatbelt.   Feels safe in her relationships.   Social Drivers of Corporate investment banker Strain: Not on file  Food Insecurity: Not on file  Transportation Needs: Not on file  Physical Activity: Not on file  Stress: Not on file  Social Connections: Unknown (08/06/2021)   Received from [facility], [facility]   Social Network    Social Network: Not on file    ECOG Status: 1 - Symptomatic but completely ambulatory   Review of Systems Review of Systems: A 12 point ROS discussed and pertinent positives are indicated in the HPI above.  All other systems are negative. Physical  Exam Constitutional: Oriented to person, place, and time. Well-developed and well-nourished. No distress.   HENT:  Head: Normocephalic and atraumatic.  Eyes: Conjunctivae and EOM are normal. Right eye exhibits no discharge. Left eye exhibits no discharge. No scleral icterus.  Neck: No JVD present.  Pulmonary/Chest: Effort normal. No stridor. No respiratory distress.  Abdomen: soft, non distended Neurological:  alert and oriented to person, place, and time.  Skin: Skin is warm and dry.  not diaphoretic.  Psychiatric:   normal mood and affect.   behavior is normal. Judgment and thought content normal.   Vital Signs: BP 112/69   Pulse 76   Temp 97.8 F (36.6 C) (Oral)   Resp 16   SpO2 99%          Imaging: CLINICAL DATA:  43 year old female with chest and back pain. Mid chest pain radiating to the back. Questionable hydronephrosis on initial CTA chest images, so CT Abdomen and Pelvis was added.   EXAM: CT ANGIOGRAPHY CHEST   CT ABDOMEN AND PELVIS WITH CONTRAST   TECHNIQUE: Noncontrast chest CT 1st acquired. Multidetector CT imaging of the chest was performed using the standard protocol during bolus administration of intravenous contrast. Multiplanar CT image reconstructions and MIPs were obtained to evaluate the vascular anatomy. Multidetector CT imaging of the abdomen and pelvis was performed using the standard protocol during bolus administration of intravenous contrast.   CONTRAST:  OMNIPAQUE IOHEXOL 350 MG/ML SOLN   COMPARISON:  Noncontrast CT Abdomen and Pelvis 02/29/2020-06/10/2013. Chest radiographs 0040 hours today.   FINDINGS: CTA CHEST FINDINGS   Cardiovascular: Good aortic contrast timing. Negative for thoracic aortic aneurysm or dissection. Central pulmonary arteries also opacified and appear to be patent. Cardiac size at the upper limits of normal. No pericardial effusion. Proximal great vessels appear patent, mildly tortuous.    Mediastinum/Nodes: Negative.  No mediastinal lymphadenopathy.   Lungs/Pleura: Somewhat low lung volumes. Major airways are patent. Symmetric mostly dependent atelectasis in both lungs. Some superimposed mosaic attenuation in the lower lobes compatible with gas trapping. No pleural effusion, and lungs otherwise appear negative.   Musculoskeletal: Negative.   Review of the MIP images confirms the above findings.   CT ABDOMEN and PELVIS FINDINGS   Hepatobiliary: Negative liver and gallbladder.   Pancreas: Negative.   Spleen: Negative.   Adrenals/Urinary Tract: Normal left adrenal gland. Delayed renal contrast imaging only. Negative for hydronephrosis, but positive for chronic bilateral benign renal parapelvic cysts. These have mildly increased on the right since 2015. And there is a superimposed exophytic roughly 3 cm lesion arising from the right upper pole with macroscopic fat consistent with angiomyolipoma. Size not significantly changed from last year. No adjacent inflammation.   Symmetric renal contrast excretion. Both ureters appear normal. Unremarkable urinary bladder containing excreted contrast. Punctate nephrolithiasis which was greater on the right is poorly identified today  due to contrast.   Stomach/Bowel: Negative large bowel aside from mild redundancy and retained stool. Prior appendectomy on series 2, image 50. Terminal ileum appears negative. No dilated small bowel. Negative stomach and duodenum. No free air, free fluid or mesenteric inflammation.   Vascular/Lymphatic: Delayed contrast timing only in the abdomen and pelvis due to the order of image acquisition. Grossly patent major vascular structures. No lymphadenopathy identified.   Reproductive: Stable since 2015, including IUD.   Other: No pelvic free fluid.   Musculoskeletal: Chronic L5-S1 disc and endplate degeneration. No acute osseous abnormality identified.   Review of the MIP images confirms  the above findings.   IMPRESSION: 1. CTA Chest is negative aside from mild atelectasis and gas trapping. No thoracic aortic dissection or aneurysm.   2. No acute or inflammatory process identified in the abdomen or pelvis.   3. Negative for hydronephrosis. Positive for chronic renal parapelvic cysts, and a 3 cm right upper pole renal angiomyolipoma which has not significantly changed since last year.     Electronically Signed   By: Odessa Fleming M.D.   On: 01/19/2021 04:22  Labs:  CBC: Recent Labs    03/27/23 0844  WBC 4.1  HGB 14.1  HCT 42.1  PLT 278.0    COAGS: No results for input(s): "INR", "APTT" in the last 8760 hours.  BMP: Recent Labs    03/27/23 0844  NA 140  K 4.2  CL 105  CO2 28  GLUCOSE 82  BUN 15  CALCIUM 9.2  CREATININE 0.72    LIVER FUNCTION TESTS: Recent Labs    03/27/23 0844  BILITOT 0.6  AST 23  ALT 43*  ALKPHOS 64  PROT 6.6  ALBUMIN 3.9    TUMOR MARKERS: No results for input(s): "AFPTM", "CEA", "CA199", "CHROMGRNA" in the last 8760 hours.  Assessment and Plan: My impression is that her enlarging RUP renal AML, while benign, has grown to a size at which prophylactic treatment is indicated to minimize risk of spontaneous hemorrhage and associated complications (>4cm). We discussed the benign nature of AMLs which are not precancerous. We discussed the elevated risk of acute hemorrhage. We discussed that embolization has been shown to significantly decrease bleeding risk, although the AML will persist, and may grow in the future, potentially with characteristics that might require repeat embolization. We discussed the embolization technique, anticipated benefits, possible risks and complications,   alternatives, and potentential for further surveillance imaging. She is motivated to proceed. Accordingly, we can set her up for elective  subselective R renal embolization with moderate sedation at [facility], at her convenience.   Thank you for this  interesting consult.  I greatly enjoyed meeting Leilynn Pilat and look forward to participating in their care.  A copy of this report was sent to the requesting provider on this date.  Electronically Signed: Durwin Glaze 05/31/2023, 12:36 PM   I spent a total of  40 Minutes   in face to face in clinical consultation, greater than 50% of which was counseling/coordinating care for enlarging RUP renal angiomyolipoma without hemorrhage.

## 2023-06-09 ENCOUNTER — Other Ambulatory Visit: Payer: Self-pay | Admitting: Urology

## 2023-06-09 DIAGNOSIS — D1771 Benign lipomatous neoplasm of kidney: Secondary | ICD-10-CM

## 2023-06-23 ENCOUNTER — Other Ambulatory Visit: Payer: Self-pay | Admitting: Radiology

## 2023-06-23 DIAGNOSIS — D1771 Benign lipomatous neoplasm of kidney: Secondary | ICD-10-CM

## 2023-06-23 NOTE — H&P (Signed)
 Chief Complaint: Enlarging right renal angiomyolipoma; referred for renal arteriogram with embolization of angiomyolipoma  Referring Provider(s): Pace,M  Supervising Physician: Oley Balm  Patient Status: [facility] - Out-pt  History of Present Illness: Alexandra Edwards is a 43 y.o. female ex smoker with PMH sig for depression, GERD, renal stones, migraines and known right upper pole renal AML. Recent imaging reveals enlargement of AML, now 4.8 cm max diameter. Pt was seen in consultation by Dr. Deanne Coffer on 05/31/23 to discuss treatment options and was deemed an appropriate candidate for elective subselective R renal AML embolization with moderate sedation to minimize risk of spontaneous hemorrhage and associated complications . She presents today for the procedure.    Patient is Full Code  Past Medical History:  Diagnosis Date   Chicken pox    Depression    was on effexor; doing well off now   Frequent urinary tract infections    GERD (gastroesophageal reflux disease)    History of kidney stones    Migraine    Urinary incontinence     Past Surgical History:  Procedure Laterality Date   APPENDECTOMY  2009   CESAREAN SECTION  2001, 2003   EXTRACORPOREAL SHOCK WAVE LITHOTRIPSY Left 11/28/2016   Procedure: EXTRACORPOREAL SHOCK WAVE LITHOTRIPSY (ESWL);  Surgeon: Crist Fat, MD;  Location: [facility];  Service: Urology;  Laterality: Left;  [phone] [id number]   EXTRACORPOREAL SHOCK WAVE LITHOTRIPSY Left 01/19/2017   Procedure: LEFT EXTRACORPOREAL SHOCK WAVE LITHOTRIPSY (ESWL);  Surgeon: Malen Gauze, MD;  Location[facility];  Service: Urology;  Laterality: Left;   surgery to remove bladder polyps     2002   URETERAL STENT PLACEMENT Left 2002   urinary stent placement      secondary to kidney stones    Allergies: Patient has no known allergies.  Medications: Prior to Admission medications   Medication Sig Start Date End Date Taking?  Authorizing Provider  atorvastatin (LIPITOR) 10 MG tablet Take 1 tablet (10 mg total) by mouth at bedtime. 03/27/23   Kuneff, Renee A, DO  oxybutynin (DITROPAN-XL) 10 MG 24 hr tablet Take 10 mg by mouth daily. 11/12/19   [provider]     Family History  Problem Relation Age of Onset   Hyperlipidemia Mother    Uterine cancer Mother    Diabetes Maternal Grandfather    Ulcers Paternal Grandmother    Arthritis Paternal Grandmother    Depression Paternal Grandmother    Asthma Son    Breast cancer Neg Hx     Social History   Socioeconomic History   Marital status: Married    Spouse name: Not on file   Number of children: 2   Years of education: Not on file   Highest education level: Not on file  Occupational History   Occupation: Accoiunting  Tobacco Use   Smoking status: Former    Current packs/day: 0.25    Average packs/day: 0.3 packs/day for 6.0 years (1.5 ttl pk-yrs)    Types: Cigarettes    Passive exposure: Never   Smokeless tobacco: Never  Vaping Use   Vaping status: Never Used  Substance and Sexual Activity   Alcohol use: No    Comment: socially   Drug use: No   Sexual activity: Yes    Partners: Male    Birth control/protection: I.U.D.  Other Topics Concern   Not on file  Social History Narrative   Ms Abeyta lives w/husband & 2 sons. She relocated from [place].  College-educated. 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   Former smoker.   Drinks caffeine.   Smoke lamina home.   Wears her seatbelt.   Feels safe in her relationships.   Social Drivers of Corporate investment banker Strain: Not on file  Food Insecurity: Not on file  Transportation Needs: Not on file  Physical Activity: Not on file  Stress: Not on file  Social Connections: Unknown (08/06/2021)   Received from [facility], [facility]   Social Network    Social Network: Not on file       Review of Systems denies fever,HA,CP,dyspnea, cough, abd/back pain,N/V or  bleeding  Vital Signs: Vitals:   06/26/23 0905  BP: 103/63  Pulse: 73  Resp: 16  Temp: 98.1 F (36.7 C)  SpO2: 98%      Advance Care Plan: no documents on file  Physical Exam: awake/alert; chest- CTA bilat; heart- RRR; abd-soft,+BS,NT; no LE edema  Imaging: No results found.  Labs:  CBC: Recent Labs    03/27/23 0844  WBC 4.1  HGB 14.1  HCT 42.1  PLT 278.0    COAGS: No results for input(s): "INR", "APTT" in the last 8760 hours.  BMP: Recent Labs    03/27/23 0844  NA 140  K 4.2  CL 105  CO2 28  GLUCOSE 82  BUN 15  CALCIUM 9.2  CREATININE 0.72    LIVER FUNCTION TESTS: Recent Labs    03/27/23 0844  BILITOT 0.6  AST 23  ALT 43*  ALKPHOS 64  PROT 6.6  ALBUMIN 3.9    TUMOR MARKERS: No results for input(s): "AFPTM", "CEA", "CA199", "CHROMGRNA" in the last 8760 hours.  Assessment and Plan: 43 y.o. female ex smoker with PMH sig for depression, GERD, renal stones, migraines and known right upper pole renal AML. Recent imaging reveals enlargement of AML, now 4.8 cm max diameter. Pt was seen in consultation by Dr. Deanne Coffer on 05/31/23 to discuss treatment options and was deemed an appropriate candidate for elective subselective R renal AML embolization with moderate sedation to minimize risk of spontaneous hemorrhage and associated complications . She presents today for the procedure.Risks and benefits of procedure were discussed with the patient including, but not limited to bleeding, infection, vascular injury or contrast induced renal failure.  This interventional procedure involves the use of X-rays and because of the nature of the planned procedure, it is possible that we will have prolonged use of X-ray fluoroscopy.  Potential radiation risks to you include (but are not limited to) the following: - A slightly elevated risk for cancer  several years later in life. This risk is typically less than 0.5% percent. This risk is low in comparison to the normal  incidence of human cancer, which is 33% for women and 50% for men according to the American Cancer Society. - Radiation induced injury can include skin redness, resembling a rash, tissue breakdown / ulcers and hair loss (which can be temporary or permanent).   The likelihood of either of these occurring depends on the difficulty of the procedure and whether you are sensitive to radiation due to previous procedures, disease, or genetic conditions.   IF your procedure requires a prolonged use of radiation, you will be notified and given written instructions for further action.  It is your responsibility to monitor the irradiated area for the 2 weeks following the procedure and to notify your physician if you are concerned that you have suffered a radiation induced injury.  All of the patient's questions were answered, patient is agreeable to proceed.  Consent signed and in chart.      Thank you for allowing our service to participate in Alexandra Edwards 's care.  Electronically Signed: D. Jeananne Rama, PA-C   06/23/2023, 1:42 PM      I spent a total of    25 Minutes in face to face in clinical consultation, greater than 50% of which was counseling/coordinating care for right renal arteriogram with right renal angiomyolipoma embolization

## 2023-06-26 ENCOUNTER — Ambulatory Visit (HOSPITAL_COMMUNITY)
Admission: RE | Admit: 2023-06-26 | Discharge: 2023-06-26 | Disposition: A | Discharge status: Home / Self Care | Source: Ambulatory Visit | Attending: Urology | Admitting: Urology

## 2023-06-26 ENCOUNTER — Other Ambulatory Visit: Payer: Self-pay | Admitting: Urology

## 2023-06-26 ENCOUNTER — Other Ambulatory Visit: Payer: Self-pay

## 2023-06-26 DIAGNOSIS — K219 Gastro-esophageal reflux disease without esophagitis: Secondary | ICD-10-CM | POA: Diagnosis not present

## 2023-06-26 DIAGNOSIS — D1771 Benign lipomatous neoplasm of kidney: Secondary | ICD-10-CM

## 2023-06-26 DIAGNOSIS — Z87891 Personal history of nicotine dependence: Secondary | ICD-10-CM | POA: Diagnosis not present

## 2023-06-26 HISTORY — PX: IR RENAL SUPRASEL UNI S&I MOD SED: IMG655

## 2023-06-26 HISTORY — PX: IR US GUIDE VASC ACCESS RIGHT: IMG2390

## 2023-06-26 HISTORY — PX: IR EMBO TUMOR ORGAN ISCHEMIA INFARCT INC GUIDE ROADMAPPING: IMG5449

## 2023-06-26 LAB — CBC WITH DIFFERENTIAL/PLATELET
Abs Immature Granulocytes: 0.01 10*3/uL (ref 0.00–0.07)
Basophils Absolute: 0 10*3/uL (ref 0.0–0.1)
Basophils Relative: 1 %
Eosinophils Absolute: 0.1 10*3/uL (ref 0.0–0.5)
Eosinophils Relative: 2 %
HCT: 38.2 % (ref 36.0–46.0)
Hemoglobin: 12.5 g/dL (ref 12.0–15.0)
Immature Granulocytes: 0 %
Lymphocytes Relative: 31 %
Lymphs Abs: 1.4 10*3/uL (ref 0.7–4.0)
MCH: 30.1 pg (ref 26.0–34.0)
MCHC: 32.7 g/dL (ref 30.0–36.0)
MCV: 92 fL (ref 80.0–100.0)
Monocytes Absolute: 0.4 10*3/uL (ref 0.1–1.0)
Monocytes Relative: 9 %
Neutro Abs: 2.6 10*3/uL (ref 1.7–7.7)
Neutrophils Relative %: 57 %
Platelets: 199 10*3/uL (ref 150–400)
RBC: 4.15 MIL/uL (ref 3.87–5.11)
RDW: 12.2 % (ref 11.5–15.5)
WBC: 4.4 10*3/uL (ref 4.0–10.5)
nRBC: 0 % (ref 0.0–0.2)

## 2023-06-26 LAB — PROTIME-INR
INR: 1 (ref 0.8–1.2)
Prothrombin Time: 13.6 s (ref 11.4–15.2)

## 2023-06-26 LAB — BASIC METABOLIC PANEL
Anion gap: 6 (ref 5–15)
BUN: 20 mg/dL (ref 6–20)
CO2: 24 mmol/L (ref 22–32)
Calcium: 8.5 mg/dL — ABNORMAL LOW (ref 8.9–10.3)
Chloride: 109 mmol/L (ref 98–111)
Creatinine, Ser: 0.83 mg/dL (ref 0.44–1.00)
GFR, Estimated: >60 mL/min (ref >60)
Glucose, Bld: 78 mg/dL (ref 70–99)
Potassium: 3.7 mmol/L (ref 3.5–5.1)
Sodium: 139 mmol/L (ref 135–145)

## 2023-06-26 LAB — POCT PREGNANCY, URINE: Preg Test, Ur: NEGATIVE

## 2023-06-26 MED ORDER — FENTANYL CITRATE (PF) 100 MCG/2ML IJ SOLN
INTRAMUSCULAR | Status: AC | PRN
  Administered 2023-06-26: 25 ug via INTRAVENOUS

## 2023-06-26 MED ORDER — CEFAZOLIN SODIUM-DEXTROSE 2-4 GM/100ML-% IV SOLN
INTRAVENOUS | Status: AC
Start: 2023-06-26 — End: ?
  Filled 2023-06-26: qty 100

## 2023-06-26 MED ORDER — CEFAZOLIN SODIUM-DEXTROSE 2-4 GM/100ML-% IV SOLN
2.0000 g | INTRAVENOUS | Status: DC
Start: 2023-06-26 — End: 2023-06-26

## 2023-06-26 MED ORDER — MIDAZOLAM HCL 2 MG/2ML IJ SOLN
INTRAMUSCULAR | Status: AC
  Filled 2023-06-26: qty 4

## 2023-06-26 MED ORDER — HYDROCODONE-ACETAMINOPHEN 5-325 MG PO TABS: 1.0000 | ORAL_TABLET | ORAL | Status: DC | PRN

## 2023-06-26 MED ORDER — SODIUM CHLORIDE 0.9 % IV SOLN: INTRAVENOUS | Status: DC

## 2023-06-26 MED ORDER — LIDOCAINE HCL 1 % IJ SOLN
20.0000 mL | Freq: Once | INTRAMUSCULAR | Status: AC
  Administered 2023-06-26: 3 mL via INTRADERMAL

## 2023-06-26 MED ORDER — CEFAZOLIN SODIUM-DEXTROSE 2-4 GM/100ML-% IV SOLN
2.0000 g | INTRAVENOUS | Status: AC
  Administered 2023-06-26: 2 g via INTRAVENOUS

## 2023-06-26 MED ORDER — IOHEXOL 300 MG/ML  SOLN
50.0000 mL | Freq: Once | INTRAMUSCULAR | Status: AC | PRN
  Administered 2023-06-26: 20 mL

## 2023-06-26 MED ORDER — FENTANYL CITRATE (PF) 100 MCG/2ML IJ SOLN
INTRAMUSCULAR | Status: AC | PRN
  Administered 2023-06-26: 50 ug via INTRAVENOUS

## 2023-06-26 MED ORDER — IOHEXOL 300 MG/ML  SOLN
100.0000 mL | Freq: Once | INTRAMUSCULAR | Status: AC | PRN
  Administered 2023-06-26: 25 mL via INTRA_ARTERIAL

## 2023-06-26 MED ORDER — FENTANYL CITRATE (PF) 100 MCG/2ML IJ SOLN
INTRAMUSCULAR | Status: AC
  Filled 2023-06-26: qty 4

## 2023-06-26 MED ORDER — MIDAZOLAM HCL 2 MG/2ML IJ SOLN
INTRAMUSCULAR | Status: AC | PRN
  Administered 2023-06-26: 1 mg via INTRAVENOUS

## 2023-06-26 MED ORDER — LIDOCAINE HCL 1 % IJ SOLN
INTRAMUSCULAR | Status: AC
  Filled 2023-06-26: qty 20

## 2023-06-26 MED ORDER — MIDAZOLAM HCL 2 MG/2ML IJ SOLN
INTRAMUSCULAR | Status: AC | PRN
Start: 2023-06-26 — End: 2023-06-26
  Administered 2023-06-26: 1 mg via INTRAVENOUS

## 2023-06-26 NOTE — Procedures (Signed)
  Procedure:  R renal arteriorgram and subselective embolization of RUP AML   Preprocedure diagnosis: The encounter diagnosis was Renal angiomyolipoma. Postprocedure diagnosis: same EBL:    minimal Complications:   none immediate  See full dictation in [employer].  Thora Lance MD Main # [phone] Pager  [phone] Mobile [phone]

## 2023-06-26 NOTE — Discharge Instructions (Addendum)
Please call Interventional Radiology clinic [phone] with any questions or concerns.  You may remove your dressing and shower tomorrow.  Moderate Conscious Sedation-Care After  This sheet gives you information about how to care for yourself after your procedure. Your health care provider may also give you more specific instructions. If you have problems or questions, contact your health care provider.  After the procedure, it is common to have: Sleepiness for several hours. Impaired judgment for several hours. Difficulty with balance. Vomiting if you eat too soon.  Follow these instructions at home:  Rest. Do not participate in activities where you could fall or become injured. Do not drive or use machinery. Do not drink alcohol. Do not take sleeping pills or medicines that cause drowsiness. Do not make important decisions or sign legal documents. Do not take care of children on your own.  Eating and drinking Follow the diet recommended by your health care provider. Drink enough fluid to keep your urine pale yellow. If you vomit: Drink water, juice, or soup when you can drink without vomiting. Make sure you have little or no nausea before eating solid foods.  General instructions Take over-the-counter and prescription medicines only as told by your health care provider. Have a responsible adult stay with you for the time you are told. It is important to have someone help care for you until you are awake and alert. Do not smoke. Keep all follow-up visits as told by your health care provider. This is important.  Contact a health care provider if: You are still sleepy or having trouble with balance after 24 hours. You feel light-headed. You keep feeling nauseous or you keep vomiting. You develop a rash. You have a fever. You have redness or swelling around the IV site.  Get help right away if: You have trouble breathing. You have new-onset confusion at home.  This  information is not intended to replace advice given to you by your health care provider. Make sure you discuss any questions you have with your healthcare provider.

## 2023-07-03 ENCOUNTER — Ambulatory Visit (HOSPITAL_BASED_OUTPATIENT_CLINIC_OR_DEPARTMENT_OTHER): Payer: Managed Care, Other (non HMO) | Admitting: Certified Nurse Midwife

## 2023-07-03 ENCOUNTER — Encounter (HOSPITAL_BASED_OUTPATIENT_CLINIC_OR_DEPARTMENT_OTHER): Payer: Self-pay | Admitting: Certified Nurse Midwife

## 2023-07-03 ENCOUNTER — Other Ambulatory Visit (HOSPITAL_COMMUNITY)
Admission: RE | Admit: 2023-07-03 | Discharge: 2023-07-03 | Disposition: A | Discharge status: Home / Self Care | Source: Ambulatory Visit | Attending: Certified Nurse Midwife | Admitting: Certified Nurse Midwife

## 2023-07-03 VITALS — BP 98/62 | HR 75 | Ht 62.25 in | Wt 147.8 lb

## 2023-07-03 DIAGNOSIS — Z124 Encounter for screening for malignant neoplasm of cervix: Secondary | ICD-10-CM | POA: Insufficient documentation

## 2023-07-03 DIAGNOSIS — Z1211 Encounter for screening for malignant neoplasm of colon: Secondary | ICD-10-CM | POA: Diagnosis not present

## 2023-07-03 DIAGNOSIS — Z30431 Encounter for routine checking of intrauterine contraceptive device: Secondary | ICD-10-CM | POA: Insufficient documentation

## 2023-07-03 DIAGNOSIS — Z01419 Encounter for gynecological examination (general) (routine) without abnormal findings: Secondary | ICD-10-CM | POA: Diagnosis not present

## 2023-07-03 NOTE — Progress Notes (Signed)
 ANNUAL EXAM Patient name: Alexandra Edwards MRN [id number]  Date of birth: [birth year] Chief Complaint:   New Patient (Initial Visit) (Annual Exam/)  History of Present Illness:   Alexandra Edwards is a 43 y.o. G3P2 Caucasian female being seen today for a routine annual exam.  Current complaints: None. Patient has had Paragard IUD for 10 years (her 2nd Paragard) and she would like to come back for removal of Paragard and placement of new Paragard IUD for contraception. She has 2 grown sons. She reports monthly menstrual cycles.   Patient's last menstrual period was 06/10/2023.  Last pap ?2019. Results were:  negative . H/O abnormal pap: no Last mammogram: 12/19/2022. Results were: normal. Family h/o breast cancer: no Last colonoscopy: No prior colonoscopy but pt agreeable to referral to GI for Screening Colonoscopy.      07/03/2023    8:32 AM 03/01/2021    8:37 AM 11/24/2020    1:09 PM 11/21/2019    7:30 PM 04/09/2018    1:09 PM  Depression screen PHQ 2/9  Decreased Interest 0 0 0 0 0  Down, Depressed, Hopeless 0 0 0 0 0  PHQ - 2 Score 0 0 0 0 0  Altered sleeping     0  Tired, decreased energy     1  Change in appetite     0  Feeling bad or failure about yourself      0  Trouble concentrating     0  Moving slowly or fidgety/restless     0  Suicidal thoughts     0  PHQ-9 Score     1  Difficult doing work/chores     Not difficult at all        06/09/2017    9:06 AM 05/15/2017   11:07 AM  GAD 7 : Generalized Anxiety Score  Nervous, Anxious, on Edge 0 2  Control/stop worrying 0 0  Worry too much - different things 0 1  Trouble relaxing 0 0  Restless 0 1  Easily annoyed or irritable 0 2  Afraid - awful might happen 0 1  Total GAD 7 Score 0 7     Review of Systems:   Pertinent items are noted in HPI Denies any headaches, blurred vision, fatigue, shortness of breath, chest pain, abdominal pain, abnormal vaginal discharge/itching/odor/irritation, problems with  periods, bowel movements, urination, or intercourse unless otherwise stated above. Pertinent History Reviewed:  Reviewed past medical,surgical, social and family history.  Reviewed problem list, medications and allergies. Physical Assessment:   Vitals:   07/03/23 0829  BP: 98/62  Pulse: 75  Weight: 147 lb 12.8 oz (67 kg)  Height: 5' 2.25" (1.581 m)  Body mass index is 26.82 kg/m.        Physical Examination:   General appearance - well appearing, and in no distress  Mental status - alert, oriented to person, place, and time  Psych:  She has a normal mood and affect  Skin - warm and dry, normal color, no suspicious lesions noted  Chest - effort normal, all lung fields clear to auscultation bilaterally  Heart - normal rate and regular rhythm  Neck:  midline trachea, no thyromegaly or nodules  Breasts - breasts appear normal, no suspicious masses, no skin or nipple changes or  axillary nodes  Abdomen - soft, nontender, nondistended, no masses or organomegaly  Pelvic - VULVA: normal appearing vulva with no masses, tenderness or lesions  VAGINA: normal appearing vagina with normal color and  discharge, no lesions  CERVIX: normal appearing cervix without discharge or lesions, no CMT  Thin prep pap is done with  HR HPV cotesting  UTERUS: uterus is felt to be normal size, shape, consistency and nontender   ADNEXA: No adnexal masses or tenderness noted.  Extremities:  No swelling or varicosities noted  Chaperone present for exam  No results found for this or any previous visit (from the past 24 hours).  Assessment & Plan:  1) Well-Woman Exam - Routine pap smear with HPV CoTesting collected - Self breast awareness encouraged  2) Surveillance Paragard IUD - IUD strings visible and palpable - Pt will RTO for removal Paragard IUD and insertion new Paragard IUD  Labs/procedures today: None  Mammogram:  annually  , or sooner if problems Colonoscopy: schedule screening colonoscopy as  soon as possible, or sooner if problems  No orders of the defined types were placed in this encounter.   Meds: No orders of the defined types were placed in this encounter.   Follow-up: No follow-ups on file.  Sigmund Hazel, CMA 07/03/2023 8:36 AM

## 2023-07-05 LAB — CYTOLOGY - PAP
Comment: NEGATIVE
Diagnosis: NEGATIVE
High risk HPV: NEGATIVE

## 2023-07-06 ENCOUNTER — Encounter (HOSPITAL_BASED_OUTPATIENT_CLINIC_OR_DEPARTMENT_OTHER): Payer: Self-pay | Admitting: Certified Nurse Midwife

## 2023-07-06 ENCOUNTER — Ambulatory Visit (HOSPITAL_BASED_OUTPATIENT_CLINIC_OR_DEPARTMENT_OTHER): Admitting: Certified Nurse Midwife

## 2023-07-07 ENCOUNTER — Ambulatory Visit (INDEPENDENT_AMBULATORY_CARE_PROVIDER_SITE_OTHER): Admitting: Certified Nurse Midwife

## 2023-07-07 ENCOUNTER — Encounter (HOSPITAL_BASED_OUTPATIENT_CLINIC_OR_DEPARTMENT_OTHER): Payer: Self-pay | Admitting: Certified Nurse Midwife

## 2023-07-07 VITALS — BP 106/71 | HR 58 | Wt 147.6 lb

## 2023-07-07 DIAGNOSIS — Z30433 Encounter for removal and reinsertion of intrauterine contraceptive device: Secondary | ICD-10-CM | POA: Diagnosis not present

## 2023-07-07 DIAGNOSIS — Z30430 Encounter for insertion of intrauterine contraceptive device: Secondary | ICD-10-CM | POA: Insufficient documentation

## 2023-07-07 MED ORDER — IBUPROFEN 600 MG PO TABS
600.0000 mg | ORAL_TABLET | Freq: Once | ORAL | Status: AC
  Administered 2023-07-07: 600 mg via ORAL

## 2023-07-07 MED ORDER — PARAGARD INTRAUTERINE COPPER IU IUD
1.0000 | INTRAUTERINE_SYSTEM | Freq: Once | INTRAUTERINE | Status: AC
  Administered 2023-07-07: 1 via INTRAUTERINE

## 2023-07-07 NOTE — Progress Notes (Signed)
  Paraguard removed, New Paragard inserted  10 years  On her period     GYNECOLOGY OFFICE PROCEDURE NOTE  Alexandra Edwards is a 43 y.o. G3P2 here for Paragard removal and Paragard  IUD insertion. No GYN concerns.    IUD Insertion Procedure Note Patient identified, informed consent performed, consent signed.   Discussed risks of irregular bleeding, cramping, infection, malpositioning or misplacement of the IUD outside the uterus which may require further procedure such as laparoscopy. Time out was performed.  She is on her period.   Speculum placed in the vagina.  Cervix visualized. Paragard IUD removed w/ ring forcep w/o difficulty.  Cleaned with Betadine x 2.  Grasped anteriorly with a single tooth tenaculum.  Uterus sounded to 5 cm.  IUD placed per manufacturer's recommendations.  Strings trimmed to 3 cm. Tenaculum was removed, good hemostasis noted.  Patient tolerated procedure well.   Patient was given post-procedure instructions.  She was advised to have backup contraception for one week.  Patient was also asked to check IUD strings periodically and follow up in 4 weeks for IUD check.   No follow-ups on file.   Alexandra Edwards, CNM 12:20 PM

## 2023-08-08 ENCOUNTER — Encounter (HOSPITAL_BASED_OUTPATIENT_CLINIC_OR_DEPARTMENT_OTHER): Payer: Self-pay | Admitting: Certified Nurse Midwife

## 2023-08-08 ENCOUNTER — Ambulatory Visit (HOSPITAL_BASED_OUTPATIENT_CLINIC_OR_DEPARTMENT_OTHER): Admitting: Certified Nurse Midwife

## 2023-08-08 VITALS — BP 102/58 | HR 68 | Ht 62.0 in | Wt 151.8 lb

## 2023-08-08 DIAGNOSIS — Z1211 Encounter for screening for malignant neoplasm of colon: Secondary | ICD-10-CM | POA: Insufficient documentation

## 2023-08-08 DIAGNOSIS — Z800 Family history of malignant neoplasm of digestive organs: Secondary | ICD-10-CM

## 2023-08-08 DIAGNOSIS — Z30431 Encounter for routine checking of intrauterine contraceptive device: Secondary | ICD-10-CM | POA: Diagnosis not present

## 2023-08-08 NOTE — Progress Notes (Signed)
 Subjective:     Alexandra Edwards is a 43 y.o. female G3P2 here for IUD check. Paragard  IUD inserted one month ago. Pt satisfied with IUD and denies complaints. Denies menstrual or urinary complaints.  Her Mom lives in [place]  and was recently diagnosed with some type of Colon/GI cancer. Pt agreeable to Colonoscopy (referral placed).    The following portions of the patient's history were reviewed and updated as appropriate: allergies, current medications, past family history, past medical history, past social history, past surgical history, and problem list.   Review of Systems Pertinent items are noted in HPI.    Objective:    BP (!) 102/58 (BP Location: Right Arm, Patient Position: Sitting, Cuff Size: Large)   Pulse 68   Ht 5\' 2"  (1.575 m)   Wt 151 lb 12.8 oz (68.9 kg)   BMI 27.76 kg/m  General appearance: alert, cooperative, and appears stated age Pelvic: cervix normal in appearance, external genitalia normal, no adnexal masses or tenderness, no cervical motion tenderness, vagina normal without discharge, and IUD strings visible    Assessment:    Surveillance Paragard  IUD.  Family Hx Colon Cancer   Plan:    IUD string check complete Referral placed for Colonoscopy. RTO 1 year for annual gyn exam and prn if issues arise.  Yolanda Hence

## 2023-09-21 ENCOUNTER — Encounter: Payer: Self-pay | Admitting: Pediatrics

## 2023-10-26 ENCOUNTER — Other Ambulatory Visit: Payer: Self-pay | Admitting: Medical Genetics

## 2023-10-27 ENCOUNTER — Other Ambulatory Visit (HOSPITAL_COMMUNITY)
Admission: RE | Admit: 2023-10-27 | Discharge: 2023-10-27 | Disposition: A | Discharge status: Home / Self Care | Payer: Self-pay | Source: Ambulatory Visit | Attending: Oncology | Admitting: Oncology

## 2023-11-02 ENCOUNTER — Ambulatory Visit (AMBULATORY_SURGERY_CENTER)

## 2023-11-02 VITALS — Ht 62.0 in | Wt 150.0 lb

## 2023-11-02 DIAGNOSIS — Z1211 Encounter for screening for malignant neoplasm of colon: Secondary | ICD-10-CM

## 2023-11-02 MED ORDER — NA SULFATE-K SULFATE-MG SULF 17.5-3.13-1.6 GM/177ML PO SOLN: 1.0000 | Freq: Once | ORAL | 0 refills | Status: AC

## 2023-11-02 NOTE — Progress Notes (Signed)

## 2023-11-03 ENCOUNTER — Encounter: Payer: Self-pay | Admitting: Pediatrics

## 2023-11-03 ENCOUNTER — Telehealth: Payer: Self-pay | Admitting: Pediatrics

## 2023-11-03 NOTE — Telephone Encounter (Signed)
 New instructions generated and sent through MyChart.  Message left on patient's voicemail.

## 2023-11-03 NOTE — Telephone Encounter (Signed)
 Please update patient prep instructions.

## 2023-11-10 LAB — GENECONNECT MOLECULAR SCREEN: Genetic Analysis Overall Interpretation: NEGATIVE

## 2023-11-16 ENCOUNTER — Encounter: Admitting: Pediatrics

## 2023-12-11 NOTE — Progress Notes (Unsigned)
 [facility] History and Physical   Primary Care Physician:  Catherine Charlies LABOR, DO   Reason for Procedure:  Colorectal cancer screening  Plan:    Screening colonoscopy     HPI: Alexandra Edwards is a 43 y.o. female undergoing screening colonoscopy for colorectal cancer screening.  This is the patient's first colonoscopy.  No family history of colorectal cancer or polyps.  Patient denies change in bowel habits or rectal bleeding at the time of today's exam.   Past Medical History:  Diagnosis Date   Anxiety    Chicken pox    Depression    was on effexor ; doing well off now   Frequent urinary tract infections    GERD (gastroesophageal reflux disease)    History of kidney stones    Hyperlipidemia    Migraine    Urinary incontinence     Past Surgical History:  Procedure Laterality Date   APPENDECTOMY  2009   CESAREAN SECTION  2001, 2003   EXTRACORPOREAL SHOCK WAVE LITHOTRIPSY Left 11/28/2016   Procedure: EXTRACORPOREAL SHOCK WAVE LITHOTRIPSY (ESWL);  Surgeon: Cam Morene ORN, MD;  Location: [facility];  Service: Urology;  Laterality: Left;  [phone] [id number]   EXTRACORPOREAL SHOCK WAVE LITHOTRIPSY Left 01/19/2017   Procedure: LEFT EXTRACORPOREAL SHOCK WAVE LITHOTRIPSY (ESWL);  Surgeon: Sherrilee Belvie CROME, MD;  Location: [facility];  Service: Urology;  Laterality: Left;   IR EMBO TUMOR ORGAN ISCHEMIA INFARCT INC GUIDE ROADMAPPING  06/26/2023   IR RENAL SUPRASEL UNI S&I MOD SED  06/26/2023   IR US  GUIDE VASC ACCESS RIGHT  06/26/2023   surgery to remove bladder polyps     2002   URETERAL STENT PLACEMENT Left 2002   urinary stent placement      secondary to kidney stones    Prior to Admission medications   Medication Sig Start Date End Date Taking? Authorizing Provider  atorvastatin  (LIPITOR) 10 MG tablet Take 1 tablet (10 mg total) by mouth at bedtime. Patient not taking: Reported on 11/02/2023 03/27/23   Kuneff, Renee A, DO  fesoterodine  (TOVIAZ) 8 MG TB24 tablet Take 8 mg by mouth daily.    [provider]  valACYclovir (VALTREX) 500 MG tablet Take by mouth. 10/08/23   [provider]    Current Outpatient Medications  Medication Sig Dispense Refill   fesoterodine (TOVIAZ) 8 MG TB24 tablet Take 8 mg by mouth daily.     atorvastatin  (LIPITOR) 10 MG tablet Take 1 tablet (10 mg total) by mouth at bedtime. (Patient not taking: No sig reported) 90 tablet 3   valACYclovir (VALTREX) 500 MG tablet Take by mouth.     Current Facility-Administered Medications  Medication Dose Route Frequency Provider Last Rate Last Admin   0.9 %  sodium chloride  infusion  500 mL Intravenous Once Adalbert Alberto, Inocente HERO, MD        Allergies as of 12/12/2023   (No Known Allergies)    Family History  Problem Relation Age of Onset   Hyperlipidemia Mother    Uterine cancer Mother    Colon polyps Mother    Diabetes Maternal Grandfather    Ulcers Paternal Grandmother    Arthritis Paternal Grandmother    Depression Paternal Grandmother    Asthma Son    Ovarian cancer Other    Liver cancer Other    Breast cancer Neg Hx    Colon cancer Neg Hx    Esophageal cancer Neg Hx    Rectal cancer Neg Hx    Stomach  cancer Neg Hx     Social History   Socioeconomic History   Marital status: Married    Spouse name: Not on file   Number of children: 2   Years of education: Not on file   Highest education level: Not on file  Occupational History   Occupation: Accoiunting  Tobacco Use   Smoking status: Former    Current packs/day: 0.25    Average packs/day: 0.3 packs/day for 6.0 years (1.5 ttl pk-yrs)    Types: Cigarettes    Passive exposure: Never   Smokeless tobacco: Never  Vaping Use   Vaping status: Never Used  Substance and Sexual Activity   Alcohol use: No    Comment: socially   Drug use: No   Sexual activity: Yes    Partners: Male    Birth control/protection: I.U.D.  Other Topics Concern   Not on file  Social History  Narrative   [name] lives w/husband & 2 sons. She relocated from [place] .   College-educated. 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   Former smoker.   Drinks caffeine.   Smoke lamina home.   Wears her seatbelt.   Feels safe in her relationships.   Social Drivers of Corporate investment banker Strain: Not on file  Food Insecurity: Not on file  Transportation Needs: Not on file  Physical Activity: Not on file  Stress: Not on file  Social Connections: Unknown (08/06/2021)   Received from [facility]   Social Network    Social Network: Not on file  Intimate Partner Violence: Unknown (07/07/2021)   Received from [facility]   HITS    Physically Hurt: Not on file    Insult or Talk Down To: Not on file    Threaten Physical Harm: Not on file    Scream or Curse: Not on file    Review of Systems:  All other review of systems negative except as mentioned in the HPI.  Physical Exam: Vital signs BP 111/87   Pulse 77   Temp 98.4 F (36.9 C) (Temporal)   Ht 5' 2 (1.575 m)   Wt 150 lb (68 kg)   SpO2 99%   BMI 27.44 kg/m   General:   Alert,  Well-developed, well-nourished, pleasant and cooperative in NAD Airway:  Mallampati 2 Lungs:  Clear throughout to auscultation.   Heart:  Regular rate and rhythm; no murmurs, clicks, rubs,  or gallops. Abdomen:  Soft, nontender and nondistended. Normal bowel sounds.   Neuro/Psych:  Normal mood and affect. A and O x 3  Inocente Hausen, MD [facility]

## 2023-12-12 ENCOUNTER — Ambulatory Visit (AMBULATORY_SURGERY_CENTER): Admitting: Pediatrics

## 2023-12-12 ENCOUNTER — Encounter: Payer: Self-pay | Admitting: Pediatrics

## 2023-12-12 VITALS — BP 123/68 | HR 54 | Temp 98.4°F | Resp 11 | Ht 62.0 in | Wt 150.0 lb

## 2023-12-12 DIAGNOSIS — Z1211 Encounter for screening for malignant neoplasm of colon: Secondary | ICD-10-CM | POA: Diagnosis present

## 2023-12-12 DIAGNOSIS — Z83719 Family history of colon polyps, unspecified: Secondary | ICD-10-CM

## 2023-12-12 DIAGNOSIS — K648 Other hemorrhoids: Secondary | ICD-10-CM

## 2023-12-12 MED ORDER — SODIUM CHLORIDE 0.9 % IV SOLN: 500.0000 mL | Freq: Once | INTRAVENOUS | Status: DC

## 2023-12-12 NOTE — Progress Notes (Signed)
 Pt's states no medical or surgical changes since previsit or office visit.

## 2023-12-12 NOTE — Op Note (Signed)
 [facility] Patient Name: Alexandra Edwards Procedure Date: 12/12/2023 1:08 PM MRN: [id number] Endoscopist: Inocente Hausen , MD, [id number] Age: 43 Referring MD:  Date of Birth: 12-10-1980 Gender: Female Account #: [id number] Procedure:                Colonoscopy Indications:              Colon cancer screening in patient at increased                            risk: Family history of 1st-degree relative with                            colon polyps, This is the patient's first                            colonoscopy Medicines:                Monitored Anesthesia Care Procedure:                Pre-Anesthesia Assessment:                           - Prior to the procedure, a History and Physical                            was performed, and patient medications and                            allergies were reviewed. The patient's tolerance of                            previous anesthesia was also reviewed. The risks                            and benefits of the procedure and the sedation                            options and risks were discussed with the patient.                            All questions were answered, and informed consent                            was obtained. Prior Anticoagulants: The patient has                            taken no anticoagulant or antiplatelet agents. ASA                            Grade Assessment: II - A patient with mild systemic                            disease. After reviewing the risks and benefits,  the patient was deemed in satisfactory condition to                            undergo the procedure.                           After obtaining informed consent, the colonoscope                            was passed under direct vision. Throughout the                            procedure, the patient's blood pressure, pulse, and                            oxygen saturations were monitored continuously. The                             [id number]-HQ190L Colonoscope [id number] was introduced                            through the anus and advanced to the cecum,                            identified by appendiceal orifice and ileocecal                            valve. The colonoscopy was performed without                            difficulty. The patient tolerated the procedure                            well. The quality of the bowel preparation was                            adequate. The ileocecal valve, appendiceal orifice,                            and rectum were photographed. Scope In: 1:45:01 PM Scope Out: 2:03:55 PM Scope Withdrawal Time: 0 hours 11 minutes 29 seconds  Total Procedure Duration: 0 hours 18 minutes 54 seconds  Findings:                 The perianal and digital rectal examinations were                            normal. Pertinent negatives include normal                            sphincter tone and no palpable rectal lesions.                           The colon (entire examined portion) appeared normal.  Internal hemorrhoids were found during retroflexion. Complications:            No immediate complications. Estimated blood loss:                            None. Estimated Blood Loss:     Estimated blood loss: none. Impression:               - The entire examined colon is normal.                           - Internal hemorrhoids.                           - No specimens collected. Recommendation:           - Discharge patient to home (ambulatory).                           - Repeat colonoscopy in 10 years for screening                            purposes. Patient reports being told there may be a                            facet of her mother's history that places her and                            her siblings at higher risk for colon polyps/colon                            cancer. Patient was not sure what these details                            were. If  patient learns more details she is welcome                            to provide them to our office for further review to                            determine if this impacts her colon cancer                            screening interval.                           - The findings and recommendations were discussed                            with the patient's family.                           - Return to referring physician.                           - Patient has a contact  number available for                            emergencies. The signs and symptoms of potential                            delayed complications were discussed with the                            patient. Return to normal activities tomorrow.                            Written discharge instructions were provided to the                            patient. Inocente Hausen, MD 12/12/2023 2:09:19 PM This report has been signed electronically.

## 2023-12-12 NOTE — Progress Notes (Signed)
 Vss nad trans to pacu

## 2023-12-12 NOTE — Patient Instructions (Signed)
 Resume previous diet. Continue present medications. Repeat colonoscopy in 10 years for screening purposes.  Handout provided on hemorrhoids.   YOU HAD AN ENDOSCOPIC PROCEDURE TODAY AT THE [facility]:   Refer to the procedure report that was given to you for any specific questions about what was found during the examination.  If the procedure report does not answer your questions, please call your gastroenterologist to clarify.  If you requested that your care partner not be given the details of your procedure findings, then the procedure report has been included in a sealed envelope for you to review at your convenience later.  YOU SHOULD EXPECT: Some feelings of bloating in the abdomen. Passage of more gas than usual.  Walking can help get rid of the air that was put into your GI tract during the procedure and reduce the bloating. If you had a lower endoscopy (such as a colonoscopy or flexible sigmoidoscopy) you may notice spotting of blood in your stool or on the toilet paper. If you underwent a bowel prep for your procedure, you may not have a normal bowel movement for a few days.  Please Note:  You might notice some irritation and congestion in your nose or some drainage.  This is from the oxygen used during your procedure.  There is no need for concern and it should clear up in a day or so.  SYMPTOMS TO REPORT IMMEDIATELY:  Following lower endoscopy (colonoscopy or flexible sigmoidoscopy):  Excessive amounts of blood in the stool  Significant tenderness or worsening of abdominal pains  Swelling of the abdomen that is new, acute  Fever of 100F or higher  For urgent or emergent issues, a gastroenterologist can be reached at any hour by calling [phone]. Do not use MyChart messaging for urgent concerns.    DIET:  We do recommend a small meal at first, but then you may proceed to your regular diet.  Drink plenty of fluids but you should avoid alcoholic beverages for 24  hours.  ACTIVITY:  You should plan to take it easy for the rest of today and you should NOT DRIVE or use heavy machinery until tomorrow (because of the sedation medicines used during the test).    FOLLOW UP: Our staff will call the number listed on your records the next business day following your procedure.  We will call around 7:15- 8:00 am to check on you and address any questions or concerns that you may have regarding the information given to you following your procedure. If we do not reach you, we will leave a message.     If any biopsies were taken you will be contacted by phone or by letter within the next 1-3 weeks.  Please call us  at [phone] if you have not heard about the biopsies in 3 weeks.    SIGNATURES/CONFIDENTIALITY: You and/or your care partner have signed paperwork which will be entered into your electronic medical record.  These signatures attest to the fact that that the information above on your After Visit Summary has been reviewed and is understood.  Full responsibility of the confidentiality of this discharge information lies with you and/or your care-partner.

## 2023-12-13 ENCOUNTER — Telehealth: Payer: Self-pay

## 2023-12-13 NOTE — Telephone Encounter (Signed)
 Left message

## 2024-03-29 ENCOUNTER — Encounter: Payer: Managed Care, Other (non HMO) | Admitting: Family Medicine

## 2024-07-04 ENCOUNTER — Ambulatory Visit (HOSPITAL_BASED_OUTPATIENT_CLINIC_OR_DEPARTMENT_OTHER): Payer: Self-pay | Admitting: Obstetrics and Gynecology
